# Patient Record
Sex: Female | Born: 1985 | Race: Black or African American | Hispanic: No | Marital: Single | Smoking: Former smoker
Health system: Southern US, Community
[De-identification: ages and names within clinical notes are randomized; demographics above are authoritative.]

## PROBLEM LIST (undated history)

## (undated) DIAGNOSIS — E785 Hyperlipidemia, unspecified: Secondary | ICD-10-CM

## (undated) DIAGNOSIS — R0602 Shortness of breath: Secondary | ICD-10-CM

## (undated) DIAGNOSIS — I209 Angina pectoris, unspecified: Secondary | ICD-10-CM

## (undated) DIAGNOSIS — I509 Heart failure, unspecified: Secondary | ICD-10-CM

## (undated) DIAGNOSIS — T7840XA Allergy, unspecified, initial encounter: Secondary | ICD-10-CM

## (undated) DIAGNOSIS — I10 Essential (primary) hypertension: Secondary | ICD-10-CM

## (undated) HISTORY — PX: NO PAST SURGERIES: SHX2092

## (undated) HISTORY — DX: Essential (primary) hypertension: I10

## (undated) HISTORY — DX: Hyperlipidemia, unspecified: E78.5

## (undated) HISTORY — DX: Heart failure, unspecified: I50.9

## (undated) HISTORY — DX: Allergy, unspecified, initial encounter: T78.40XA

---
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
## 2007-03-30 ENCOUNTER — Emergency Department (HOSPITAL_COMMUNITY): Admission: EM | Admit: 2007-03-30 | Discharge: 2007-03-31 | Payer: Self-pay | Admitting: *Deleted

## 2007-06-08 ENCOUNTER — Inpatient Hospital Stay (HOSPITAL_COMMUNITY): Admission: EM | Admit: 2007-06-08 | Discharge: 2007-06-11 | Payer: Self-pay | Admitting: Emergency Medicine

## 2007-06-11 ENCOUNTER — Ambulatory Visit: Payer: Self-pay | Admitting: *Deleted

## 2007-06-11 ENCOUNTER — Inpatient Hospital Stay (HOSPITAL_COMMUNITY): Admission: RE | Admit: 2007-06-11 | Discharge: 2007-06-16 | Payer: Self-pay | Admitting: *Deleted

## 2008-08-05 IMAGING — CR DG CHEST 2V
2 series · 2 of 2 positions shown · non-contrast
Comparison: none

CLINICAL DATA: Short of breath. 
 CHEST ? 2 VIEW:

[w chest lat *]
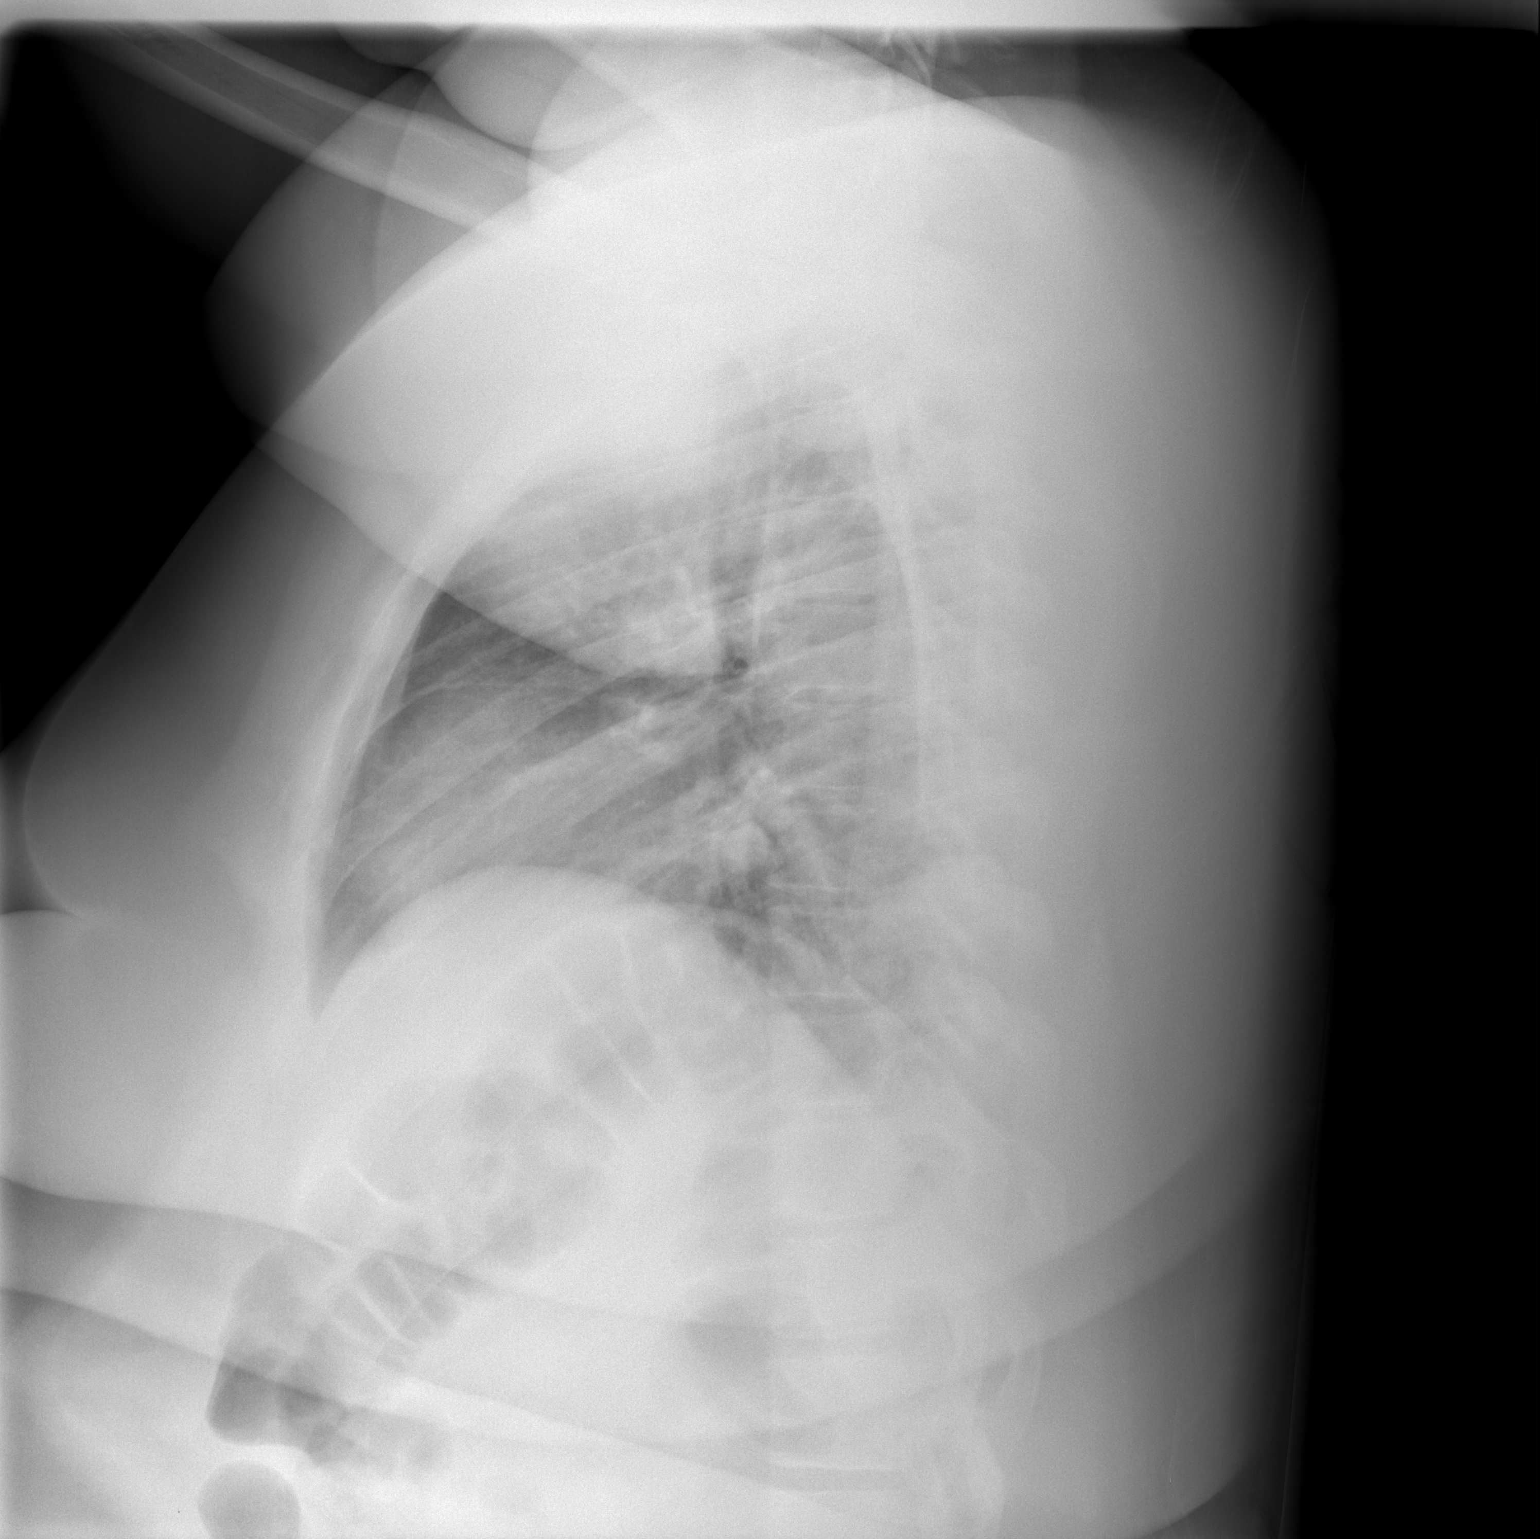

[view not recorded]
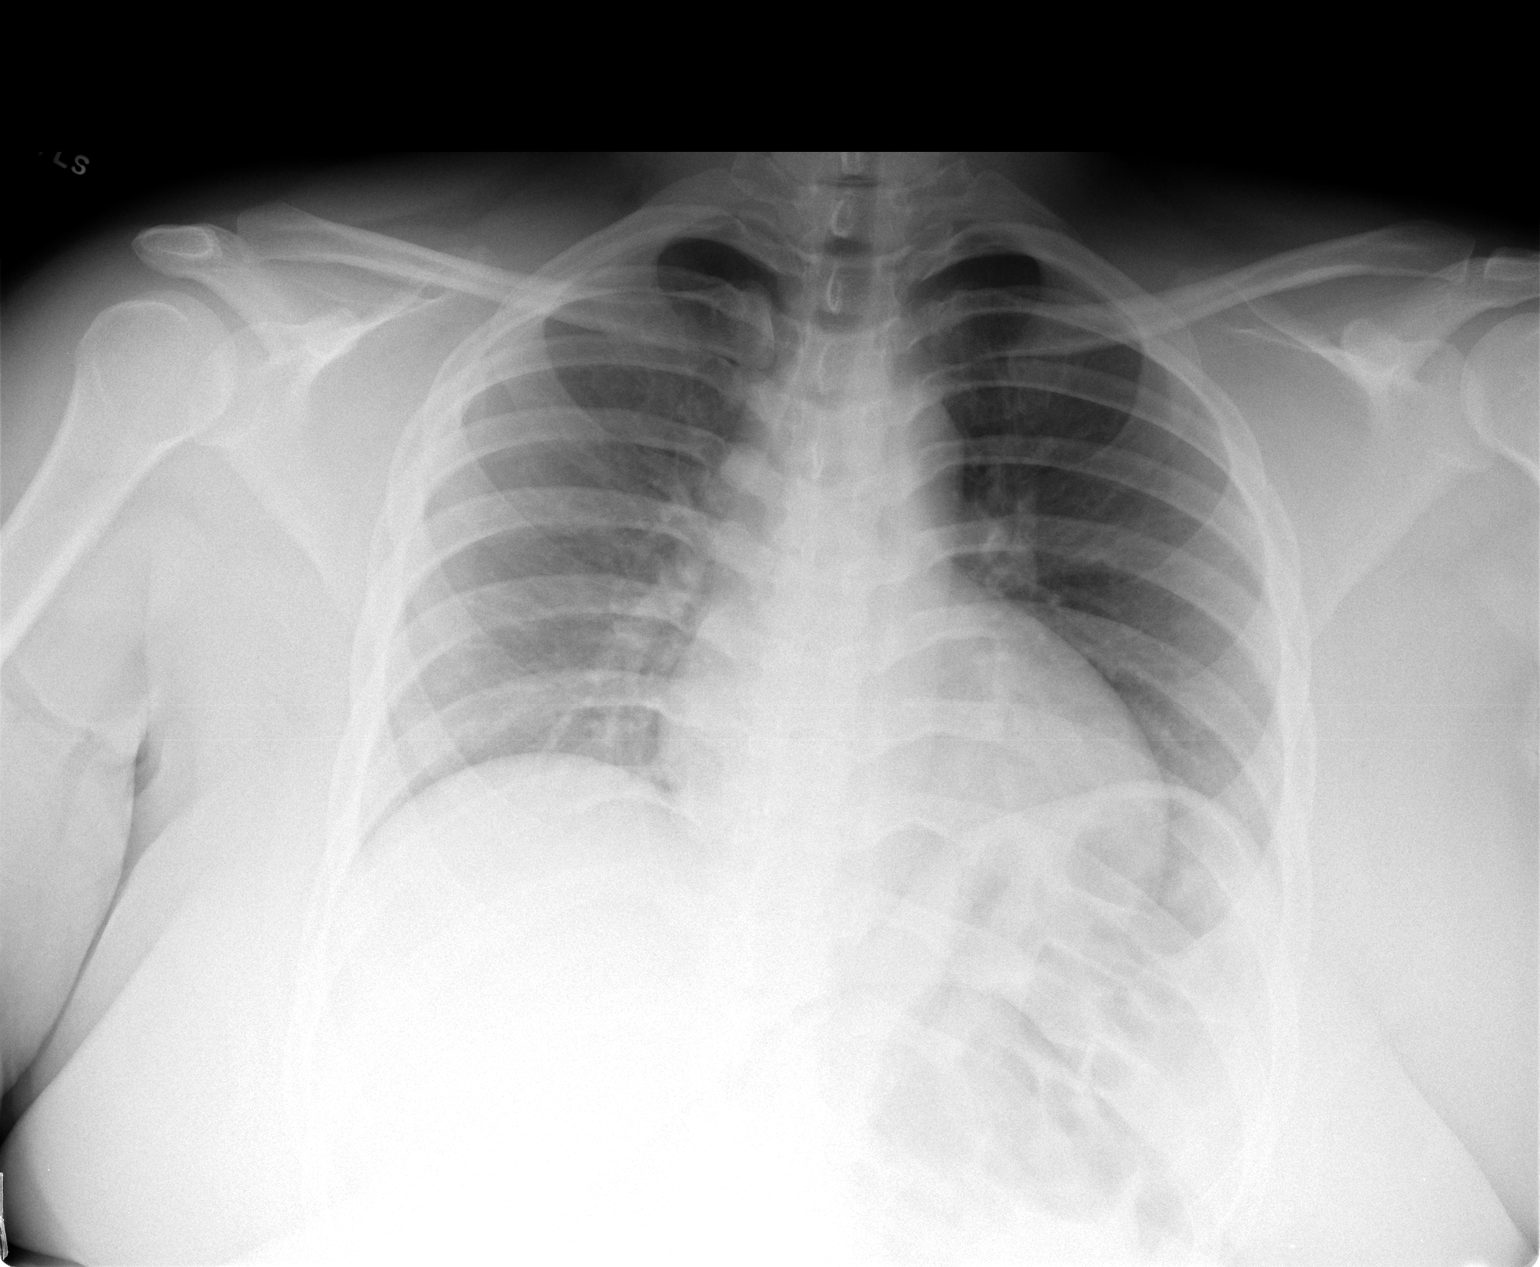

[2 of 2 positions shown; findings below may reference images not displayed]

FINDINGS: The heart size and mediastinal contours are within normal limits.  Both lungs are clear.  The visualized skeletal structures are unremarkable.
IMPRESSION: No active cardiopulmonary disease.

## 2008-08-06 IMAGING — RF DG FLUORO RM 1-60 MIN
1 series · 1 of 1 positions shown · non-contrast
Comparison: none

CLINICAL DATA: Fever

LUMBAR PUNCTURE WITH FLUOROSCOPIC GUIDANCE:

[Series 1: run · 1 of 1 slices shown]
[im 1/1]
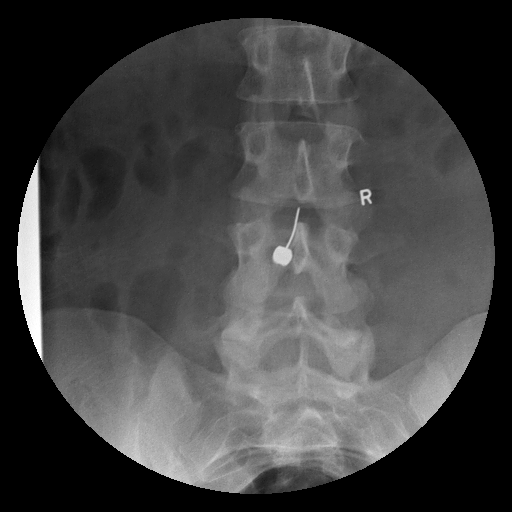

[1 of 1 positions shown; findings below may reference images not displayed]

FINDINGS: Written informed consent was obtained for the procedure. The patient
was placed prone on the Fluoro table and the back prepped and draped in sterile
fashion. The skin was anesthetized with 1% lidocaine. A 20  gauge spinal needle
was advanced into the thecal sac at the L3-L4 level. Opening pressure was 17 cm
of water. Approximately 4-6 cc of clear  CSF was collected. The patient
tolerated the procedure well.
IMPRESSION: Successful lumbar puncture under fluoroscopic guidance as described above.

## 2009-08-03 ENCOUNTER — Emergency Department (HOSPITAL_COMMUNITY): Admission: EM | Admit: 2009-08-03 | Discharge: 2009-08-03 | Payer: Self-pay | Admitting: Emergency Medicine

## 2011-04-07 LAB — RAPID STREP SCREEN (MED CTR MEBANE ONLY): Streptococcus, Group A Screen (Direct): POSITIVE — AB
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
## 2011-10-18 LAB — RAPID URINE DRUG SCREEN, HOSP PERFORMED
Amphetamines: NOT DETECTED
Barbiturates: NOT DETECTED
Benzodiazepines: POSITIVE — AB
Cocaine: NOT DETECTED
Opiates: POSITIVE — AB
Tetrahydrocannabinol: NOT DETECTED

## 2011-10-18 LAB — COMPREHENSIVE METABOLIC PANEL
ALT: 44 — ABNORMAL HIGH
ALT: 70 — ABNORMAL HIGH
AST: 14
AST: 42 — ABNORMAL HIGH
Albumin: 3.9
CO2: 26
CO2: 27
Calcium: 10
Chloride: 99
Creatinine, Ser: 0.89
Creatinine, Ser: 1.17
GFR calc Af Amer: >60
GFR calc Af Amer: >60
GFR calc non Af Amer: 60 — ABNORMAL LOW
GFR calc non Af Amer: >60
Potassium: 4
Sodium: 136
Sodium: 144
Total Bilirubin: 0.6
Total Protein: 8

## 2011-10-18 LAB — CBC
HCT: 39.5
Hemoglobin: 13.3
MCHC: 33
MCHC: 33.6
MCHC: 34
MCV: 80.2
MCV: 81
MCV: 81.8
Platelets: 336
Platelets: 432 — ABNORMAL HIGH
RBC: 4.88
RBC: 4.88
RBC: 5
RBC: 5.2 — ABNORMAL HIGH
RDW: 12.2
RDW: 13.1
WBC: 11.1 — ABNORMAL HIGH
WBC: 11.7 — ABNORMAL HIGH

## 2011-10-18 LAB — PREGNANCY, URINE: Preg Test, Ur: NEGATIVE

## 2011-10-18 LAB — BASIC METABOLIC PANEL
BUN: 6
CO2: 24
CO2: 28
Calcium: 9.2
Chloride: 101
Chloride: 104
Creatinine, Ser: 0.86
GFR calc Af Amer: >60
GFR calc Af Amer: >60
Glucose, Bld: 100 — ABNORMAL HIGH
Potassium: 3.1 — ABNORMAL LOW

## 2011-10-18 LAB — CSF CULTURE W GRAM STAIN: Gram Stain: NONE SEEN

## 2011-10-18 LAB — PROTEIN, TOTAL: Total Protein: 7.4

## 2011-10-18 LAB — URINALYSIS, ROUTINE W REFLEX MICROSCOPIC
Bilirubin Urine: NEGATIVE
Hgb urine dipstick: NEGATIVE
Hgb urine dipstick: NEGATIVE
Ketones, ur: 40 — AB
Nitrite: NEGATIVE
Nitrite: NEGATIVE
Protein, ur: 30 — AB
Protein, ur: NEGATIVE
Specific Gravity, Urine: 1.026
Urobilinogen, UA: 1
Urobilinogen, UA: 1

## 2011-10-18 LAB — URINE CULTURE: Colony Count: 75000

## 2011-10-18 LAB — URINE MICROSCOPIC-ADD ON

## 2011-10-18 LAB — DIFFERENTIAL
Eosinophils Absolute: 0.2
Eosinophils Relative: 1
Lymphocytes Relative: 28
Lymphs Abs: 3.8 — ABNORMAL HIGH
Lymphs Abs: 3.8 — ABNORMAL HIGH
Monocytes Absolute: 1 — ABNORMAL HIGH
Monocytes Relative: 8
Monocytes Relative: 9
Neutrophils Relative %: 63

## 2011-10-18 LAB — CSF CELL COUNT WITH DIFFERENTIAL: Tube #: 2

## 2011-10-18 LAB — PROTEIN AND GLUCOSE, CSF: Glucose, CSF: 67

## 2012-08-18 DIAGNOSIS — I209 Angina pectoris, unspecified: Secondary | ICD-10-CM

## 2012-08-18 HISTORY — DX: Angina pectoris, unspecified: I20.9

## 2012-08-19 ENCOUNTER — Observation Stay (HOSPITAL_COMMUNITY)
Admission: AD | Admit: 2012-08-19 | Discharge: 2012-08-20 | Disposition: A | Discharge status: Home / Self Care | Payer: 59 | Source: Ambulatory Visit | Attending: Cardiology | Admitting: Cardiology

## 2012-08-19 ENCOUNTER — Ambulatory Visit: Payer: 59

## 2012-08-19 ENCOUNTER — Ambulatory Visit (INDEPENDENT_AMBULATORY_CARE_PROVIDER_SITE_OTHER): Payer: 59 | Admitting: Family Medicine

## 2012-08-19 ENCOUNTER — Encounter (HOSPITAL_COMMUNITY): Payer: Self-pay | Admitting: General Practice

## 2012-08-19 VITALS — BP 198/150 | HR 91 | Temp 98.4°F | Resp 16 | Ht 61.75 in | Wt 236.2 lb

## 2012-08-19 DIAGNOSIS — I5031 Acute diastolic (congestive) heart failure: Secondary | ICD-10-CM | POA: Diagnosis present

## 2012-08-19 DIAGNOSIS — R079 Chest pain, unspecified: Secondary | ICD-10-CM

## 2012-08-19 DIAGNOSIS — R0602 Shortness of breath: Secondary | ICD-10-CM

## 2012-08-19 DIAGNOSIS — I110 Hypertensive heart disease with heart failure: Principal | ICD-10-CM | POA: Diagnosis present

## 2012-08-19 DIAGNOSIS — I10 Essential (primary) hypertension: Secondary | ICD-10-CM

## 2012-08-19 DIAGNOSIS — R0609 Other forms of dyspnea: Secondary | ICD-10-CM | POA: Insufficient documentation

## 2012-08-19 DIAGNOSIS — R0989 Other specified symptoms and signs involving the circulatory and respiratory systems: Secondary | ICD-10-CM | POA: Insufficient documentation

## 2012-08-19 DIAGNOSIS — I5033 Acute on chronic diastolic (congestive) heart failure: Secondary | ICD-10-CM | POA: Insufficient documentation

## 2012-08-19 HISTORY — DX: Shortness of breath: R06.02

## 2012-08-19 HISTORY — DX: Angina pectoris, unspecified: I20.9

## 2012-08-19 LAB — POCT URINALYSIS DIPSTICK
Glucose, UA: NEGATIVE
Leukocytes, UA: NEGATIVE
Nitrite, UA: NEGATIVE

## 2012-08-19 LAB — COMPREHENSIVE METABOLIC PANEL
Alkaline Phosphatase: 42 U/L (ref 39–117)
BUN: 15 mg/dL (ref 6–23)
Calcium: 9.5 mg/dL (ref 8.4–10.5)
GFR calc Af Amer: 86 mL/min — ABNORMAL LOW (ref >90)
Glucose, Bld: 84 mg/dL (ref 70–99)
Total Protein: 7.5 g/dL (ref 6.0–8.3)

## 2012-08-19 LAB — POCT CBC
Granulocyte percent: 65 %G (ref 37–80)
HCT, POC: 46.6 % (ref 37.7–47.9)
MCV: 83.4 fL (ref 80–97)
POC Granulocyte: 5.4 (ref 2–6.9)
POC LYMPH PERCENT: 29.9 %L (ref 10–50)
Platelet Count, POC: 411 10*3/uL (ref 142–424)
RBC: 5.59 M/uL — AB (ref 4.04–5.48)
RDW, POC: 15 %

## 2012-08-19 LAB — POCT UA - MICROSCOPIC ONLY
Mucus, UA: POSITIVE
Yeast, UA: NEGATIVE

## 2012-08-19 LAB — POCT URINE PREGNANCY: Preg Test, Ur: NEGATIVE

## 2012-08-19 LAB — CBC
Hemoglobin: 14.1 g/dL (ref 12.0–15.0)
MCH: 27.1 pg (ref 26.0–34.0)
MCHC: 34.2 g/dL (ref 30.0–36.0)
Platelets: 335 10*3/uL (ref 150–400)
RDW: 14.1 % (ref 11.5–15.5)

## 2012-08-19 LAB — CARDIAC PANEL(CRET KIN+CKTOT+MB+TROPI): Total CK: 136 U/L (ref 7–177)

## 2012-08-19 MED ORDER — SODIUM CHLORIDE 0.9 % IJ SOLN
3.0000 mL | Freq: Two times a day (BID) | INTRAMUSCULAR | Status: DC
  Administered 2012-08-19 – 2012-08-20 (×2): 3 mL via INTRAVENOUS

## 2012-08-19 MED ORDER — LISINOPRIL 10 MG PO TABS
10.0000 mg | ORAL_TABLET | Freq: Every day | ORAL | Status: DC
  Administered 2012-08-19: 10 mg via ORAL
  Filled 2012-08-19 (×2): qty 1

## 2012-08-19 MED ORDER — HEPARIN SODIUM (PORCINE) 5000 UNIT/ML IJ SOLN
5000.0000 [IU] | Freq: Three times a day (TID) | INTRAMUSCULAR | Status: DC
  Administered 2012-08-19 – 2012-08-20 (×3): 5000 [IU] via SUBCUTANEOUS
  Filled 2012-08-19 (×7): qty 1

## 2012-08-19 MED ORDER — FENTANYL CITRATE 0.05 MG/ML IJ SOLN
25.0000 ug | INTRAMUSCULAR | Status: DC | PRN
  Administered 2012-08-19 – 2012-08-20 (×2): 50 ug via INTRAVENOUS
  Filled 2012-08-19 (×3): qty 2

## 2012-08-19 MED ORDER — AMLODIPINE BESYLATE 5 MG PO TABS
5.0000 mg | ORAL_TABLET | Freq: Every day | ORAL | Status: DC
  Administered 2012-08-19: 5 mg via ORAL
  Filled 2012-08-19 (×2): qty 1

## 2012-08-19 MED ORDER — SODIUM CHLORIDE 0.9 % IJ SOLN: 3.0000 mL | Freq: Two times a day (BID) | INTRAMUSCULAR | Status: DC

## 2012-08-19 MED ORDER — LABETALOL HCL 100 MG PO TABS
100.0000 mg | ORAL_TABLET | Freq: Once | ORAL | Status: AC
  Administered 2012-08-19: 100 mg via ORAL

## 2012-08-19 MED ORDER — CARVEDILOL 12.5 MG PO TABS
12.5000 mg | ORAL_TABLET | Freq: Two times a day (BID) | ORAL | Status: DC
  Administered 2012-08-19: 12.5 mg via ORAL
  Filled 2012-08-19 (×4): qty 1

## 2012-08-19 MED ORDER — ASPIRIN EC 81 MG PO TBEC
81.0000 mg | DELAYED_RELEASE_TABLET | Freq: Every day | ORAL | Status: DC
  Administered 2012-08-19 – 2012-08-20 (×2): 81 mg via ORAL
  Filled 2012-08-19 (×2): qty 1

## 2012-08-19 MED ORDER — SODIUM CHLORIDE 0.9 % IV SOLN: 250.0000 mL | INTRAVENOUS | Status: DC | PRN

## 2012-08-19 MED ORDER — HYDROCHLOROTHIAZIDE 25 MG PO TABS
25.0000 mg | ORAL_TABLET | Freq: Every morning | ORAL | Status: DC
  Administered 2012-08-20: 25 mg via ORAL
  Filled 2012-08-19: qty 1

## 2012-08-19 MED ORDER — HYDRALAZINE HCL 20 MG/ML IJ SOLN
10.0000 mg | INTRAMUSCULAR | Status: DC | PRN
  Administered 2012-08-19: 10 mg via INTRAVENOUS
  Filled 2012-08-19: qty 1

## 2012-08-19 MED ORDER — SODIUM CHLORIDE 0.9 % IJ SOLN: 3.0000 mL | INTRAMUSCULAR | Status: DC | PRN

## 2012-08-19 MED ORDER — HYDRALAZINE HCL 50 MG PO TABS
50.0000 mg | ORAL_TABLET | Freq: Four times a day (QID) | ORAL | Status: DC
  Administered 2012-08-19 – 2012-08-20 (×2): 50 mg via ORAL
  Filled 2012-08-19 (×5): qty 1

## 2012-08-19 NOTE — Patient Instructions (Addendum)
1. Chest pain  DG Chest 2 View, POCT CBC, POCT urine pregnancy, POCT UA - Microscopic Only, POCT urinalysis dipstick, Comprehensive metabolic panel, TSH, Electrocardiogram report, Ambulatory referral to Cardiology  2. Shortness of breath  POCT CBC, POCT urine pregnancy, POCT UA - Microscopic Only, POCT urinalysis dipstick, Comprehensive metabolic panel, TSH, Electrocardiogram report, Ambulatory referral to Cardiology  3. Hypertension  labetalol (NORMODYNE) tablet 100 mg, Ambulatory referral to Cardiology

## 2012-08-19 NOTE — H&P (Signed)
Robin May is an 26 y.o. female.   Chief Complaint: Shortness of breath and dyspnea on exertion.  Uncontrolled hypertension. HPI: Robin May is a 18 African American female with long-standing history of hypertension who had last seen her physician a year ago and had stopped taking medications due to insurance reasons and did not follow up with anybody was seen today by Dr. Nilda Simmer at [facility] for  acute onset of shortness of breath and dyspnea on exertion. Patient states that last weekend she gone with her friends to the beach and with exertional activity she would notice shortness of breath but she never does.  Since then she has noticed worsening shortness of breath.  Yesterday she had a hard time going to work but still pushed herself to do this and last evening she could not lay down in bed as she would get extremely short of breath and had to sit up pretty much all night and felt very ill.  She thought she needed to be seen last night by somebody but that she will feel better in the morning.  She decided to go back to work this morning, however due to worsening symptoms could not do this. Patient also complained of chest heaviness that started yesterday with exertional activities in the middle of her chest.  It was easily relieved with rest.  No recurrence of chest pain this morning.  Presently she states that she is doing fine at rest.  She has no hemoptysis, syncope, dizziness, visual disturbances, TIA, chest pain, palpitations.  Otherwise she states that she is doing well.  Past Medical History  Diagnosis Date  . Hypertension     No past surgical history on file.  Family History  Problem Relation Age of Onset  . Hypertension Mother    Social History:  reports that she has quit smoking. She does not have any smokeless tobacco history on file. She reports that she drinks about .6 ounces of alcohol per week. She reports that she does not use illicit  drugs.  Allergies: No Known Allergies  Medications Prior to Admission  Medication Sig Dispense Refill  . Cyanocobalamin (VITAMIN B 12 PO) Take 1 tablet by mouth daily.      Marland Kitchen ibuprofen (ADVIL,MOTRIN) 200 MG tablet Take 600 mg by mouth every 6 (six) hours as needed. For pain      . naproxen sodium (ANAPROX) 220 MG tablet Take 220 mg by mouth 2 (two) times daily as needed. For pain        Results for orders placed in visit on 08/19/12 (from the past 48 hour(s))  POCT CBC     Status: Abnormal   Collection Time   08/19/12 11:53 AM      Component Value Range Comment   WBC 8.3  4.6 - 10.2 K/uL    Lymph, poc 2.5  0.6 - 3.4    POC LYMPH PERCENT 29.9  10 - 50 %L    MID (cbc) 0.4  0 - 0.9    POC MID % 5.1  0 - 12 %M    POC Granulocyte 5.4  2 - 6.9    Granulocyte percent 65.0  37 - 80 %G    RBC 5.59 (*) 4.04 - 5.48 M/uL    Hemoglobin 14.2  12.2 - 16.2 g/dL    HCT, POC 11.9  14.7 - 47.9 %    MCV 83.4  80 - 97 fL    MCH, POC 25.4 (*)  27 - 31.2 pg    MCHC 30.5 (*) 31.8 - 35.4 g/dL    RDW, POC 16.1      Platelet Count, POC 411  142 - 424 K/uL    MPV 9.8  0 - 99.8 fL   POCT URINE PREGNANCY     Status: Normal   Collection Time   08/19/12 11:53 AM      Component Value Range Comment   Preg Test, Ur Negative     POCT UA - MICROSCOPIC ONLY     Status: Abnormal   Collection Time   08/19/12 11:53 AM      Component Value Range Comment   WBC, Ur, HPF, POC 0-1      RBC, urine, microscopic 1-2      Bacteria, U Microscopic negative      Mucus, UA positive      Epithelial cells, urine per micros 0-1      Crystals, Ur, HPF, POC negative      Casts, Ur, LPF, POC negative      Yeast, UA negative     POCT URINALYSIS DIPSTICK     Status: Normal   Collection Time   08/19/12 11:53 AM      Component Value Range Comment   Color, UA dark yellow      Clarity, UA clear      Glucose, UA negative      Bilirubin, UA small      Ketones, UA trace      Spec Grav, UA >=1.030      Blood, UA trace-intact       pH, UA 6.5      Protein, UA >=300      Urobilinogen, UA 2.0      Nitrite, UA negative      Leukocytes, UA Negative      Dg Chest 2 View  08/19/2012  *RADIOLOGY REPORT*  Clinical Data: Wheezing, cough  CHEST - 2 VIEW  Comparison: None.  Findings: Suspected retrocardiac opacity, suspicious for pneumonia. No pleural effusion or pneumothorax.  Mild cardiomegaly.  Visualized osseous structures are within normal limits.  IMPRESSION: Suspected retrocardiac opacity, suspicious for left lower lobe pneumonia.  Follow-up chest radiographs are suggested to document resolution.  Mild cardiomegaly.  These results will be called to the ordering clinician or representative by the Radiologist Assistant, and communication documented in the PACS Dashboard.  Clinically significant discrepancy from primary report, if provided: Suspected left lower lobe pneumonia   Original Report Authenticated By: Charline Bills, M.D.     ROS: Arthritis-no;  Cramping of the legs and feet at night or with activity-not often; about twice a month; Diabetes-no;  Hypothyroidism-no;  Previous Stroke-no, Previous GI Bleed-no ,  Recent weight change-no, Symptoms to suggest sleep apnea like loud snoring, daytime sleepiness-snores occasionally, Other systems negative.  Blood pressure 203/150, pulse 96, temperature 98.8 F (37.1 C), temperature source Oral, resp. rate 16, last menstrual period 08/02/2012, SpO2 96.00%. BMI 40.51. BP equal in both arms.  General appearance: alert, cooperative, appears stated age and no distress Morbidly obese.  Eyes: negative findings: lids and lashes normal, conjunctivae and sclerae normal and corneas clear Neck: no adenopathy, no carotid bruit, no JVD, supple, symmetrical, trachea midline and thyroid not enlarged, symmetric, no tenderness/mass/nodules Neck: JVP - normal, carotids 2+= without bruits Resp: clear to auscultation bilaterally Chest wall: no tenderness Cardio: S1, S2 normal, S4 present and no  rub GI: soft, non-tender; bowel sounds normal; no masses,  no organomegaly Extremities: extremities normal,  atraumatic, no cyanosis or edema Pulses: 2+ and symmetric Skin: Skin color, texture, turgor normal. No rashes or lesions Neurologic: Alert and oriented X 3, normal strength and tone. Normal symmetric reflexes. Normal coordination and gait   Assessment/Plan  1.  Hypertensive urgency with patient presenting with acute on chronic diastolic heart failure. 2.  Hypertension with hypertensive heart disease. 3.  Morbid obesity with a BMI of 40.10. 4.  Shortness of breath and dyspnea on exertion. 5. Chest pain probably due to # 1.  Recommendation: Patient will be admitted to the hospital for overnight observation and may need to be extended for 48 hours until blood pressure is controlled.  Although the chest x-ray looks abnormal, I do not suspect pneumonia.  There is no mediastinal enlargement.  EKG performed on outpatient basis reveals normal sinus rhythm, normal axis without any evidence of ischemia. There was non specific T inversion in I and aVL. I will obtain an echocardiogram to evaluate her LV systolic function.  Further recommendations will follow.  I have discussed with the patient and her mother at the bedside regarding high risk nature of patient's underlying medical issues.  I will continue to reinforce lifestyle, cardiovascular risk modification.  Pamella Pert, MD 08/19/2012, 5:01 PM

## 2012-08-19 NOTE — Progress Notes (Signed)
Subjective:    Patient ID: Robin May, female    DOB: Jul 11, 1986, 26 y.o.   MRN: [id number]  HPIThis 26 y.o. female presents for evaluation of chest pain, shortness of breath.  Went to sleep last night around 1:00am; +SOB upon laying supine.  Unable to sleep due to orthopnea.  If sits up, feels fine.  +mild cough onset last night and today.  No sputum.  No malaise, fatigue.  When gets up to walk around, DOE and chest tightness.  +chest tightness substernal region with exertion; severity 3/10.  SOB is major symptom.  No recent swelling in legs or abdomen.  Just returned from beach; trip in car for 3.5 hours; stopped 1-2 times on trip.  No pain in calves.  No fever/chills/sweats.  No rhinorrhea or nasal congestion.  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  Had to sit down due to SOB.  No similar symptoms.  No chest pain currently.  No radiation into shoulder; +diaphoresis; no nausea or vomiting.  +radiation into back; no indigestion or heartburn; ate last night 6:00pm; grilled chicken nuggets, lemonade.    2.  HTN: no medications; runs 170 systolic.  Checks at home every other week; BP  160-180/unknown.  No headache; no vision changes; no dizziness; no focal tingling except in L foot (new).  No focal weakness.  Previously on Metoprolol a while back; prescribed by [facility] physician; no medication in 2-3 years.  Stopped medication secondary to financial reasons and no insurance.    PMH:  HTN since age 54.  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 Lives with mom. Family: M- 56; HTN, OA              F-63; healthy              Siblings: unknown; in [place].     Review of Systems  Constitutional: Positive for diaphoresis, activity change and fatigue.  Negative for fever, chills, appetite change and unexpected weight change.  HENT: Negative for congestion, facial swelling, rhinorrhea, neck pain and neck stiffness.   Eyes: Negative for photophobia and visual disturbance.  Respiratory: Positive for cough, chest tightness and shortness of breath. Negative for wheezing.   Cardiovascular: Positive for chest pain. Negative for palpitations and leg swelling.  Gastrointestinal: Negative for nausea, vomiting, abdominal pain, diarrhea, constipation and abdominal distention.  Skin: Negative for pallor and rash.  Neurological: Negative for dizziness, tremors, syncope, facial asymmetry, speech difficulty, weakness, light-headedness, numbness and headaches.  Psychiatric/Behavioral: Negative for disturbed wake/sleep cycle and dysphoric mood. The patient is not nervous/anxious.     Past Medical History  Diagnosis Date  . Hypertension     No past surgical history on file.  Prior to Admission medications   Not on File    No Known Allergies  History   Social History  . Marital Status: Single    Spouse Name: N/A    Number of Children: N/A  . Years of Education: N/A   Occupational History  . Not on file.   Social History Main Topics  . Smoking status: Former Games developer  . Smokeless tobacco: Not on file  . Alcohol Use: 0.6 oz/week    1 Glasses of wine per week  . Drug Use:  No  . Sexually Active: Not Currently     Last sexual activity 02/2012.   Other Topics Concern  . Not on file   Social History Narrative   Marital status: single.Lives with: motherChildren: noneEmployment: works at [employer] in 25year old class since 2008.Tobacco abuse: noneAlcohol: 1 glass of wine weeklyDrugs: noneExercise: gym three days per week (elliptical, treadmill, weights).    Family History  Problem Relation Age of Onset  . Hypertension Mother       Objective:   Physical Exam  Constitutional: She is oriented to person, place, and time. She appears  well-developed and well-nourished. No distress.  HENT:  Head: Normocephalic and atraumatic.  Right Ear: External ear normal.  Left Ear: External ear normal.  Nose: Nose normal.  Mouth/Throat: Oropharynx is clear and moist.  Eyes: Conjunctivae and EOM are normal. Pupils are equal, round, and reactive to light.  Neck: Normal range of motion. Neck supple. No JVD present. No thyromegaly present.  Cardiovascular: Normal rate, regular rhythm and intact distal pulses.  Exam reveals no gallop and no friction rub.   No murmur heard. Pulmonary/Chest: Effort normal and breath sounds normal. No respiratory distress. She has no wheezes. She has no rales. She exhibits no tenderness.  Abdominal: Soft. Bowel sounds are normal. She exhibits no distension and no mass. There is tenderness. There is no rebound and no guarding.       +TTP EPIGASTRIC>RUQ; NO G/R.    Lymphadenopathy:    She has no cervical adenopathy.  Neurological: She is alert and oriented to person, place, and time. No cranial nerve deficit. She exhibits normal muscle tone.  Skin: Skin is warm and dry. She is not diaphoretic.  Psychiatric: She has a normal mood and affect. Her behavior is normal. Judgment and thought content normal.    Results for orders placed in visit on 08/19/12  POCT CBC      Component Value Range   WBC 8.3  4.6 - 10.2 K/uL   Lymph, poc 2.5  0.6 - 3.4   POC LYMPH PERCENT 29.9  10 - 50 %L   MID (cbc) 0.4  0 - 0.9   POC MID % 5.1  0 - 12 %M   POC Granulocyte 5.4  2 - 6.9   Granulocyte percent 65.0  37 - 80 %G   RBC 5.59 (*) 4.04 - 5.48 M/uL   Hemoglobin 14.2  12.2 - 16.2 g/dL   HCT, POC 16.1  09.6 - 47.9 %   MCV 83.4  80 - 97 fL   MCH, POC 25.4 (*) 27 - 31.2 pg   MCHC 30.5 (*) 31.8 - 35.4 g/dL   RDW, POC 04.5     Platelet Count, POC 411  142 - 424 K/uL   MPV 9.8  0 - 99.8 fL  POCT URINE PREGNANCY      Component Value Range   Preg Test, Ur Negative    POCT UA - MICROSCOPIC ONLY      Component Value Range    WBC, Ur, HPF, POC 0-1     RBC, urine, microscopic 1-2     Bacteria, U Microscopic negative     Mucus, UA positive     Epithelial cells, urine per micros 0-1     Crystals, Ur, HPF, POC negative     Casts, Ur, LPF, POC negative     Yeast, UA negative    POCT URINALYSIS DIPSTICK      Component Value Range   Color, UA dark  yellow     Clarity, UA clear     Glucose, UA negative     Bilirubin, UA small     Ketones, UA trace     Spec Grav, UA >=1.030     Blood, UA trace-intact     pH, UA 6.5     Protein, UA >=300     Urobilinogen, UA 2.0     Nitrite, UA negative     Leukocytes, UA Negative      [facility] reading (PRIMARY) by  Dr. Katrinka Blazing.  CXR: cardiomegaly, NAD.      Assessment & Plan:   1. Chest pain  DG Chest 2 View, POCT CBC, POCT urine pregnancy, POCT UA - Microscopic Only, POCT urinalysis dipstick, Comprehensive metabolic panel, TSH, Electrocardiogram report, Ambulatory referral to Cardiology  2. Shortness of breath  POCT CBC, POCT urine pregnancy, POCT UA - Microscopic Only, POCT urinalysis dipstick, Comprehensive metabolic panel, TSH, Electrocardiogram report, Ambulatory referral to Cardiology  3. Hypertension  labetalol (NORMODYNE) tablet 100 mg, Ambulatory referral to Cardiology  4.  Proteinuria  1.  Chest pain: New.  Exertional and associated with DOE and orthopnea, diaphoresis.  Mild 3/10; resolves with rest.  Likely secondary to HTN uncontrolled.  EKG with mild T wave changes I, AvL; CXR with cardiomegaly.  Refer to cardiology for 2D-echo +/- Cardiolite.  To ED for acute worsening; no work until evaluated by cardiology.  Dr. Nadara Eaton to see today at 1:30pm.   2.  SOB/DOE/Orthopnea: New.  Primarily orthopnea, DOE.  Normal pulse oximetry; CXR with cardiomegaly.  Refer to cardiology today/[facility]. 3.  HTN Urgency:  New.  S/p Labetalol 100mg  po in office.  Neurologically intact; asymptomatic at rest.    Obtain labs.  No rx provided due to cardiology appointment today.  Emphasized importance  of compliance with medications to prevent end organ damage. 4.  Proteinuria:  New.  Likely secondary to HTN.  Obtain labs.

## 2012-08-20 DIAGNOSIS — I5031 Acute diastolic (congestive) heart failure: Secondary | ICD-10-CM | POA: Diagnosis present

## 2012-08-20 LAB — BASIC METABOLIC PANEL
Chloride: 105 mEq/L (ref 96–112)
Creatinine, Ser: 0.94 mg/dL (ref 0.50–1.10)
GFR calc Af Amer: >90 mL/min (ref >90)
GFR calc non Af Amer: 84 mL/min — ABNORMAL LOW (ref >90)

## 2012-08-20 LAB — CARDIAC PANEL(CRET KIN+CKTOT+MB+TROPI)
Relative Index: 1.4 (ref 0.0–2.5)
Total CK: 118 U/L (ref 7–177)
Troponin I: <0.3 ng/mL (ref <0.30)

## 2012-08-20 LAB — COMPREHENSIVE METABOLIC PANEL
ALT: 27 U/L (ref 0–35)
AST: 18 U/L (ref 0–37)
Albumin: 4.2 g/dL (ref 3.5–5.2)
CO2: 27 mEq/L (ref 19–32)
Calcium: 9.5 mg/dL (ref 8.4–10.5)
Chloride: 103 mEq/L (ref 96–112)
Potassium: 4.1 mEq/L (ref 3.5–5.3)
Total Protein: 6.8 g/dL (ref 6.0–8.3)

## 2012-08-20 LAB — HEMOGLOBIN A1C: Mean Plasma Glucose: 111 mg/dL (ref <117)

## 2012-08-20 LAB — PRO B NATRIURETIC PEPTIDE: Pro B Natriuretic peptide (BNP): 2143 pg/mL — ABNORMAL HIGH (ref 0–125)

## 2012-08-20 LAB — TSH: TSH: 2.722 u[IU]/mL (ref 0.350–4.500)

## 2012-08-20 MED ORDER — AMLODIPINE BESYLATE 10 MG PO TABS: 10.0000 mg | ORAL_TABLET | Freq: Every day | ORAL | Status: DC

## 2012-08-20 MED ORDER — HYDROCHLOROTHIAZIDE 25 MG PO TABS: 25.0000 mg | ORAL_TABLET | Freq: Every morning | ORAL | Status: DC

## 2012-08-20 MED ORDER — AMLODIPINE BESYLATE 10 MG PO TABS
10.0000 mg | ORAL_TABLET | Freq: Every day | ORAL | Status: DC
  Administered 2012-08-20: 10 mg via ORAL
  Filled 2012-08-20: qty 1

## 2012-08-20 MED ORDER — ACETAMINOPHEN 325 MG PO TABS
650.0000 mg | ORAL_TABLET | ORAL | Status: DC | PRN
  Administered 2012-08-20: 650 mg via ORAL

## 2012-08-20 MED ORDER — ACETAMINOPHEN 325 MG PO TABS: 650.0000 mg | ORAL_TABLET | ORAL | Status: AC | PRN

## 2012-08-20 MED ORDER — CARVEDILOL 25 MG PO TABS: 25.0000 mg | ORAL_TABLET | Freq: Two times a day (BID) | ORAL | Status: DC

## 2012-08-20 MED ORDER — LISINOPRIL 20 MG PO TABS: 20.0000 mg | ORAL_TABLET | Freq: Every day | ORAL | Status: DC

## 2012-08-20 MED ORDER — LISINOPRIL 20 MG PO TABS
20.0000 mg | ORAL_TABLET | Freq: Every day | ORAL | Status: DC
  Administered 2012-08-20: 20 mg via ORAL
  Filled 2012-08-20: qty 1

## 2012-08-20 MED ORDER — CARVEDILOL 25 MG PO TABS
25.0000 mg | ORAL_TABLET | Freq: Two times a day (BID) | ORAL | Status: DC
  Administered 2012-08-20: 25 mg via ORAL
  Filled 2012-08-20 (×3): qty 1

## 2012-08-20 NOTE — Discharge Summary (Signed)
Physician Discharge Summary  Patient ID: Robin May MRN: [id number] DOB/AGE: May 29, 1986 26 y.o.  Admit date: 08/19/2012 Discharge date: 08/20/2012  Primary Discharge Diagnosis:  Hypertension with hypertensive heart disease Hypertensive emergency with chest pain and shortness of breath Acute diastolic heart failure secondary to uncontrolled hypertension  Secondary Discharge Diagnosis Morbid obesity  Significant Diagnostic Studies:  Consults:   Hospital Course: Patient was admitted to the hospital after feeling ill patients having with uncontrolled blood pressure with a diastolic blood pressure of 160 mmHg.  She was felt to be unstable for patient management due to orthopnea, shortness of breath and chest pain. She was ruled out for myocardial infarction, BNP was elevated to greater than 2000, and lites BUN and creatinine along with fever within normal limits. On the day of discharge her blood pressure was reasonably well controlled and hence felt to be stable for discharge.  She did complain of mild headaches which I suspect is due to rapid decrease in blood pressure and vasodilatory effect of antidepressant medications.  She had no nausea, vomiting or any neurological deficits or tingling or numbness in the extremities.  She was able to ablate in the hallway without any problems.  Hence felt stable for discharge with outpatient followup and.   Discharge Exam: Blood pressure 151/99, pulse 83, temperature 98.1 F (36.7 C), temperature source Oral, resp. rate 20, height 5\' 2"  (1.575 m), weight 107.1 kg (236 lb 1.8 oz), last menstrual period 08/02/2012, SpO2 99.00%.   General appearance: alert, cooperative, appears stated age and no distress Morbidly obese.  Eyes: negative findings: lids and lashes normal, conjunctivae and sclerae normal and corneas clear  Neck: no adenopathy, no carotid bruit, no JVD, supple, symmetrical, trachea midline and thyroid not enlarged, symmetric, no  tenderness/mass/nodules  Neck: JVP - normal, carotids 2+= without bruits  Resp: clear to auscultation bilaterally  Chest wall: no tenderness  Cardio: S1, S2 normal, S4 present and no rub  GI: soft, non-tender; bowel sounds normal; no masses, no organomegaly  Extremities: extremities normal, atraumatic, no cyanosis or edema  Pulses: 2+ and symmetric  Skin: Skin color, texture, turgor normal. No rashes or lesions     Labs:   Lab Results  Component Value Date   WBC 8.7 08/19/2012   HGB 14.1 08/19/2012   HCT 41.2 08/19/2012   MCV 79.1 08/19/2012   PLT 335 08/19/2012    Lab 08/20/12 0900 08/19/12 1719  NA 140 --  K 3.7 --  CL 105 --  CO2 28 --  BUN 11 --  CREATININE 0.94 --  CALCIUM 9.5 --  PROT -- 7.5  BILITOT -- 1.1  ALKPHOS -- 42  ALT -- 28  AST -- 21  GLUCOSE 103* --   Lab Results  Component Value Date   CKTOTAL 118 08/20/2012   CKMB 1.7 08/20/2012   TROPONINI <0.30 08/20/2012    BNP    Component Value Date/Time   PROBNP 2143.0* 08/20/2012 0535    Radiology: Dg Chest 2 View  08/19/2012  *RADIOLOGY REPORT*  Clinical Data: Wheezing, cough  CHEST - 2 VIEW  Comparison: None.  Findings: Suspected retrocardiac opacity, suspicious for pneumonia. No pleural effusion or pneumothorax.  Mild cardiomegaly.  Visualized osseous structures are within normal limits.  IMPRESSION: Suspected retrocardiac opacity, suspicious for left lower lobe pneumonia.  Follow-up chest radiographs are suggested to document resolution.  Mild cardiomegaly.  These results will be called to the ordering clinician or representative by the Radiologist Assistant, and communication documented in the  PACS Dashboard.  Clinically significant discrepancy from primary report, if provided: Suspected left lower lobe pneumonia   Original Report Authenticated By: Charline Bills, M.D.     EKG:NSR @ 83/min. Normal intervals. Non specific T inversion in lateral leads (I, aVL). No ischemia.  FOLLOW UP PLANS AND  APPOINTMENTS  Medication List  As of 08/20/2012 12:52 PM   STOP taking these medications         ibuprofen 200 MG tablet      naproxen sodium 220 MG tablet         TAKE these medications         acetaminophen 325 MG tablet   Commonly known as: TYLENOL   Take 2 tablets (650 mg total) by mouth every 4 (four) hours as needed.      amLODipine 10 MG tablet   Commonly known as: NORVASC   Take 1 tablet (10 mg total) by mouth daily.      carvedilol 25 MG tablet   Commonly known as: COREG   Take 1 tablet (25 mg total) by mouth 2 (two) times daily with a meal.      hydrochlorothiazide 25 MG tablet   Commonly known as: HYDRODIURIL   Take 1 tablet (25 mg total) by mouth every morning.      lisinopril 20 MG tablet   Commonly known as: PRINIVIL,ZESTRIL   Take 1 tablet (20 mg total) by mouth daily.      VITAMIN B 12 PO   Take 1 tablet by mouth daily.           Follow-up Information    Follow up with Pamella Pert, MD on 08/28/2012. (11:30 AM)    Contact information:   [address]  [place] [address] [phone]       Call SMITH,KRISTI, MD. (To be seen in 2 weeks)    Contact information:   [address] [place] [address] [phone]           Pamella Pert, MD 08/20/2012, 12:52 PM

## 2012-08-20 NOTE — Progress Notes (Signed)
  Echocardiogram 2D Echocardiogram has been performed.  [place] 08/20/2012, 2:29 PM

## 2012-08-21 NOTE — Progress Notes (Signed)
Reviewed and agree.

## 2012-09-26 ENCOUNTER — Encounter: Payer: Self-pay | Admitting: Family Medicine

## 2012-09-26 DIAGNOSIS — I110 Hypertensive heart disease with heart failure: Secondary | ICD-10-CM

## 2012-11-02 ENCOUNTER — Ambulatory Visit (INDEPENDENT_AMBULATORY_CARE_PROVIDER_SITE_OTHER): Payer: 59 | Admitting: Family Medicine

## 2012-11-02 VITALS — BP 89/60 | HR 89 | Temp 99.9°F | Resp 16 | Ht 61.0 in | Wt 233.2 lb

## 2012-11-02 DIAGNOSIS — J40 Bronchitis, not specified as acute or chronic: Secondary | ICD-10-CM

## 2012-11-02 DIAGNOSIS — I10 Essential (primary) hypertension: Secondary | ICD-10-CM

## 2012-11-02 MED ORDER — HYDROCODONE-HOMATROPINE 5-1.5 MG/5ML PO SYRP: 5.0000 mL | ORAL_SOLUTION | Freq: Three times a day (TID) | ORAL | Status: DC | PRN

## 2012-11-02 MED ORDER — MOMETASONE FURO-FORMOTEROL FUM 200-5 MCG/ACT IN AERO: 2.0000 | INHALATION_SPRAY | Freq: Two times a day (BID) | RESPIRATORY_TRACT | Status: DC

## 2012-11-02 MED ORDER — AZITHROMYCIN 250 MG PO TABS: ORAL_TABLET | ORAL | Status: DC

## 2012-11-02 NOTE — Progress Notes (Signed)
@[email or web address] @   Patient ID: Robin May MRN: [id number], DOB: Jan 04, 1986, 26 y.o. Date of Encounter: 11/02/2012, 10:34 AM  Primary Physician: No primary provider on file.  Chief Complaint:  Chief Complaint  Patient presents with  . Cough    x 4 days green sputum  n/v  due to coughing    robitussin dm not helping   . Anorexia     x 4 days decreased appetite  . Spasms    back due to coughing     HPI: 26 y.o. year old female presents with a 5 day history of nasal congestion, post nasal drip, sore throat, and cough. Mild sinus pressure. Afebrile. No chills. Nasal congestion thick and green/yellow. Cough is productive of green/yellow sputum and not associated with time of day. Ears feel full, leading to sensation of muffled hearing. Has tried OTC cold preps without success. No GI complaints. Coughs until she gags.  No sick contacts, recent antibiotics, or recent travels.   No leg trauma, sedentary periods, h/o cancer, or tobacco use.  Past Medical History  Diagnosis Date  . Hypertension   . Anginal pain 08/18/2012    "mild"  . Shortness of breath 08/19/2012    "w/exertion and w/lying down"     Home Meds: Prior to Admission medications   Medication Sig Start Date End Date Taking? Authorizing Provider  amLODipine (NORVASC) 10 MG tablet Take 1 tablet (10 mg total) by mouth daily. 08/20/12 08/20/13 Yes Pamella Pert, MD  carvedilol (COREG) 25 MG tablet Take 25 mg by mouth daily. Pt taking 1 1/2 tablets qd 08/20/12 08/20/13 Yes Pamella Pert, MD  valsartan-hydrochlorothiazide (DIOVAN-HCT) 320-25 MG per tablet Take 1 tablet by mouth daily.   Yes Historical Provider, MD  acetaminophen (TYLENOL) 325 MG tablet Take 2 tablets (650 mg total) by mouth every 4 (four) hours as needed. 08/20/12 08/20/13  Pamella Pert, MD  Cyanocobalamin (VITAMIN B 12 PO) Take 1 tablet by mouth daily.    Historical Provider, MD  hydrochlorothiazide (HYDRODIURIL) 25 MG tablet Take 1 tablet (25 mg total) by  mouth every morning. 08/20/12 08/20/13  Pamella Pert, MD  lisinopril (PRINIVIL,ZESTRIL) 20 MG tablet Take 1 tablet (20 mg total) by mouth daily. 08/20/12 08/20/13  Pamella Pert, MD    Allergies: No Known Allergies  History   Social History  . Marital Status: Single    Spouse Name: N/A    Number of Children: N/A  . Years of Education: N/A   Occupational History  . Not on file.   Social History Main Topics  . Smoking status: Former Smoker    Types: Cigars    Quit date: 12/31/2005  . Smokeless tobacco: Never Used     Comment: 08/19/2012 "smoked black and milds"  . Alcohol Use: 0.6 oz/week    1 Glasses of wine per week  . Drug Use: No  . Sexually Active: Not Currently     Comment: Last sexual activity 02/2012.   Other Topics Concern  . Not on file   Social History Narrative   Marital status: single.Lives with: motherChildren: noneEmployment: works at [employer] in 26year old class since 2008.Tobacco abuse: noneAlcohol: 1 glass of wine weeklyDrugs: noneExercise: gym three days per week (elliptical, treadmill, weights).     Review of Systems: Constitutional: negative for chills, fever, night sweats or weight changes Cardiovascular: negative for chest pain or palpitations Respiratory: negative for hemoptysis, wheezing, or shortness of breath Abdominal: negative for abdominal pain, nausea, vomiting or diarrhea  Dermatological: negative for rash Neurologic: negative for headache   Physical Exam: Blood pressure 89/60, pulse 89, temperature 99.9 F (37.7 C), temperature source Oral, resp. rate 16, height 5\' 1"  (1.549 m), weight 233 lb 3.2 oz (105.779 kg), last menstrual period 11/02/2012, SpO2 98.00%., Body mass index is 44.06 kg/(m^2). General: Well developed, well nourished, in no acute distress. Head: Normocephalic, atraumatic, eyes without discharge, sclera non-icteric, nares are congested. Bilateral auditory canals clear, TM's are without perforation, pearly grey with  reflective cone of light bilaterally. No sinus TTP. Oral cavity moist, dentition normal. Posterior pharynx with post nasal drip and mild erythema. No peritonsillar abscess or tonsillar exudate. Neck: Supple. No thyromegaly. Full ROM. No lymphadenopathy. Lungs: Coarse breath sounds bilaterally with rhonchi. Breathing is unlabored.  Heart: RRR with S1 S2. No murmurs, rubs, or gallops appreciated. Msk:  Strength and tone normal for age. Extremities: No clubbing or cyanosis. No edema. Neuro: Alert and oriented X 3. Moves all extremities spontaneously. CNII-XII grossly in tact. Psych:  Responds to questions appropriately with a normal affect.   Labs:   ASSESSMENT AND PLAN:  26 y.o. year old female with bronchitis. 1. Bronchitis  azithromycin (ZITHROMAX Z-PAK) 250 MG tablet, mometasone-formoterol (DULERA) 200-5 MCG/ACT AERO, HYDROcodone-homatropine (HYCODAN) 5-1.5 MG/5ML syrup   -Tylenol/Motrin prn -Rest/fluids -RTC precautions -RTC 3-5 days if no improvement  Signed, Elvina Sidle, MD 11/02/2012 10:34 AM

## 2012-11-02 NOTE — Patient Instructions (Signed)

## 2013-10-17 IMAGING — CR DG CHEST 2V
2 series · 2 of 2 positions shown · non-contrast
Comparison: None.

CLINICAL DATA: Wheezing, cough

CHEST - 2 VIEW

[PA]
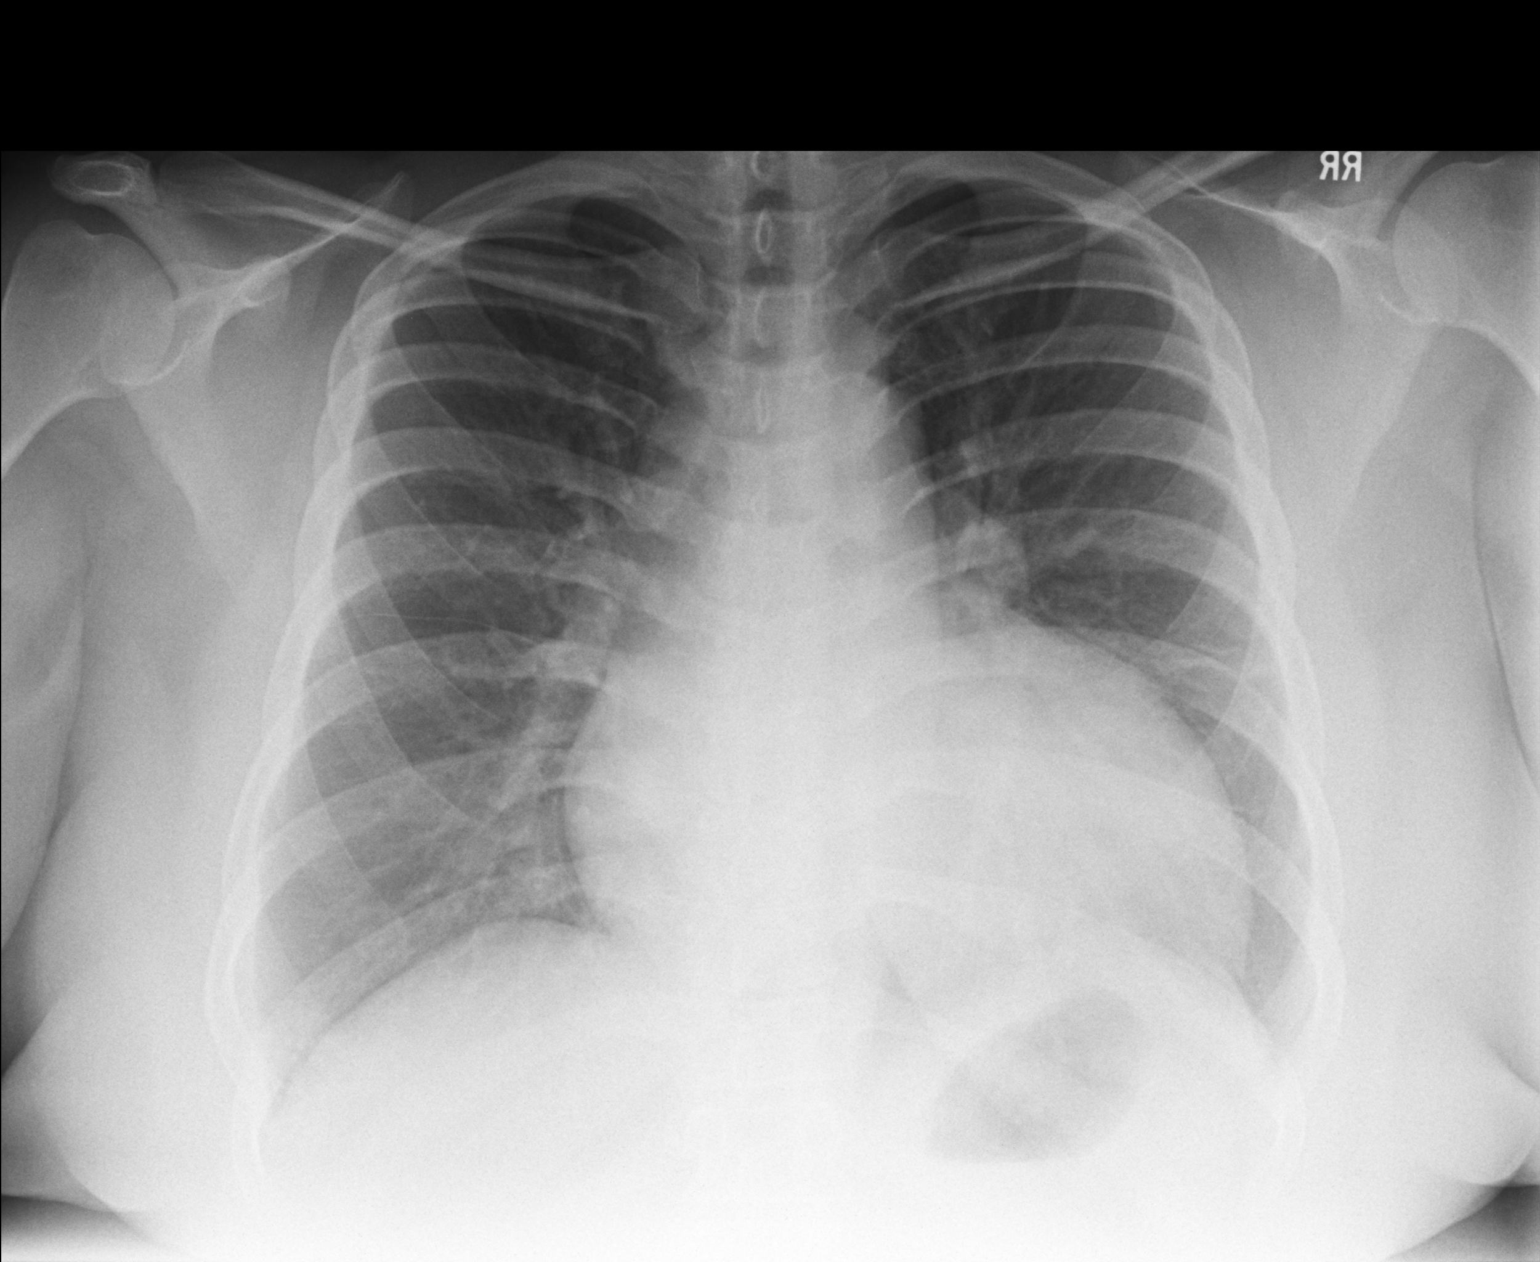

[lateral]
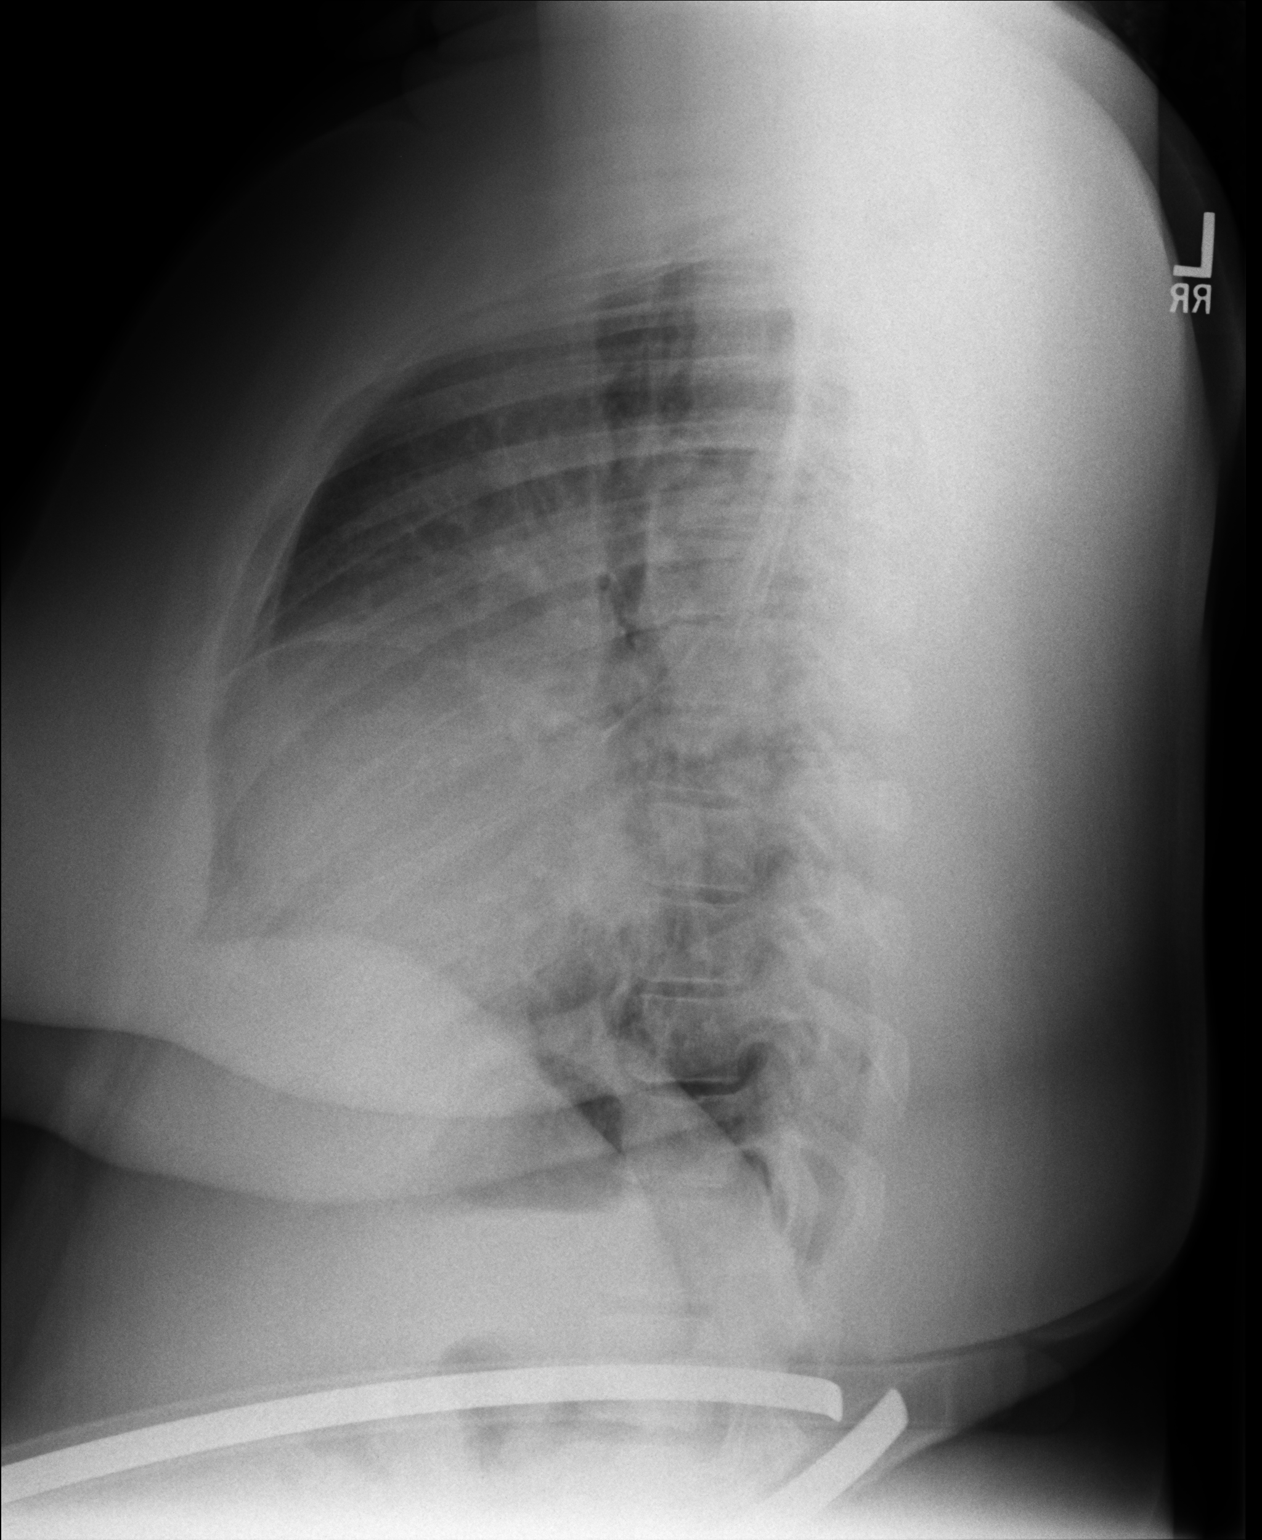

[2 of 2 positions shown; findings below may reference images not displayed]

FINDINGS: Suspected retrocardiac opacity, suspicious for pneumonia.
No pleural effusion or pneumothorax.

Mild cardiomegaly.

Visualized osseous structures are within normal limits.
IMPRESSION: Suspected retrocardiac opacity, suspicious for left lower lobe
pneumonia.  Follow-up chest radiographs are suggested to document
resolution.

Mild cardiomegaly.

These results will be called to the ordering clinician or
representative by the Radiologist Assistant, and communication
documented in the PACS Dashboard.

Clinically significant discrepancy from primary report, if
provided: Suspected left lower lobe pneumonia

## 2014-01-15 ENCOUNTER — Encounter: Payer: Self-pay | Admitting: Family Medicine

## 2014-01-15 ENCOUNTER — Ambulatory Visit (INDEPENDENT_AMBULATORY_CARE_PROVIDER_SITE_OTHER): Payer: No Typology Code available for payment source | Admitting: Family Medicine

## 2014-01-15 VITALS — BP 132/96 | HR 64 | Temp 99.3°F | Resp 16 | Ht 62.5 in | Wt 252.2 lb

## 2014-01-15 DIAGNOSIS — I110 Hypertensive heart disease with heart failure: Secondary | ICD-10-CM

## 2014-01-15 DIAGNOSIS — J309 Allergic rhinitis, unspecified: Secondary | ICD-10-CM

## 2014-01-15 DIAGNOSIS — J3089 Other allergic rhinitis: Secondary | ICD-10-CM

## 2014-01-15 DIAGNOSIS — I509 Heart failure, unspecified: Secondary | ICD-10-CM

## 2014-01-15 LAB — COMPLETE METABOLIC PANEL WITH GFR
ALBUMIN: 4.2 g/dL (ref 3.5–5.2)
ALT: 35 U/L (ref 0–35)
AST: 18 U/L (ref 0–37)
Alkaline Phosphatase: 36 U/L — ABNORMAL LOW (ref 39–117)
BUN: 11 mg/dL (ref 6–23)
CALCIUM: 10.1 mg/dL (ref 8.4–10.5)
CHLORIDE: 102 meq/L (ref 96–112)
CO2: 26 meq/L (ref 19–32)
Creat: 0.93 mg/dL (ref 0.50–1.10)
GFR, EST NON AFRICAN AMERICAN: 85 mL/min
GLUCOSE: 88 mg/dL (ref 70–99)
Potassium: 4.4 mEq/L (ref 3.5–5.3)
SODIUM: 135 meq/L (ref 135–145)
TOTAL PROTEIN: 7.3 g/dL (ref 6.0–8.3)
Total Bilirubin: 0.7 mg/dL (ref 0.3–1.2)

## 2014-01-15 LAB — LIPID PANEL
Cholesterol: 169 mg/dL (ref 0–200)
HDL: 32 mg/dL — ABNORMAL LOW (ref >39)
LDL CALC: 114 mg/dL — AB (ref 0–99)
Total CHOL/HDL Ratio: 5.3 Ratio
Triglycerides: 113 mg/dL (ref <150)
VLDL: 23 mg/dL (ref 0–40)

## 2014-01-15 LAB — TSH: TSH: 1.463 u[IU]/mL (ref 0.350–4.500)

## 2014-01-15 LAB — T4, FREE: Free T4: 1.33 ng/dL (ref 0.80–1.80)

## 2014-01-15 MED ORDER — SPIRONOLACTONE 50 MG PO TABS: 50.0000 mg | ORAL_TABLET | Freq: Every day | ORAL | Status: DC

## 2014-01-15 MED ORDER — MONTELUKAST SODIUM 10 MG PO TABS: 10.0000 mg | ORAL_TABLET | Freq: Every day | ORAL | Status: DC

## 2014-01-15 MED ORDER — VALSARTAN-HYDROCHLOROTHIAZIDE 320-25 MG PO TABS: 1.0000 | ORAL_TABLET | Freq: Every day | ORAL | Status: DC

## 2014-01-15 NOTE — Patient Instructions (Signed)
Allergic Rhinitis Allergic rhinitis is when the mucous membranes in the nose respond to allergens. Allergens are particles in the air that cause your body to have an allergic reaction. This causes you to release allergic antibodies. Through a chain of events, these eventually cause you to release histamine into the blood stream. Although meant to protect the body, it is this release of histamine that causes your discomfort, such as frequent sneezing, congestion, and an itchy, runny nose.  CAUSES  Seasonal allergic rhinitis (hay fever) is caused by pollen allergens that may come from grasses, trees, and weeds. Year-round allergic rhinitis (perennial allergic rhinitis) is caused by allergens such as house dust mites, pet dander, and mold spores.  SYMPTOMS   Nasal stuffiness (congestion).  Itchy, runny nose with sneezing and tearing of the eyes. DIAGNOSIS  Your health care provider can help you determine the allergen or allergens that trigger your symptoms. If you and your health care provider are unable to determine the allergen, skin or blood testing may be used. TREATMENT  Allergic Rhinitis does not have a cure, but it can be controlled by:  Medicines and allergy shots (immunotherapy).  Avoiding the allergen. Hay fever may often be treated with antihistamines in pill or nasal spray forms. Antihistamines block the effects of histamine. There are over-the-counter medicines that may help with nasal congestion and swelling around the eyes. Check with your health care provider before taking or giving this medicine.  If avoiding the allergen or the medicine prescribed do not work, there are many new medicines your health care provider can prescribe. Stronger medicine may be used if initial measures are ineffective. Desensitizing injections can be used if medicine and avoidance does not work. Desensitization is when a patient is given ongoing shots until the body becomes less sensitive to the allergen.  Make sure you follow up with your health care provider if problems continue. HOME CARE INSTRUCTIONS It is not possible to completely avoid allergens, but you can reduce your symptoms by taking steps to limit your exposure to them. It helps to know exactly what you are allergic to so that you can avoid your specific triggers. SEEK MEDICAL CARE IF:   You have a fever.  You develop a cough that does not stop easily (persistent).  You have shortness of breath.  You start wheezing.  Symptoms interfere with normal daily activities. Document Released: 09/11/2001 Document Revised: 10/07/2013 Document Reviewed: 08/24/2013 [facility] CenterExit[facility] Patient Information 2014 [place]Care, [place].   Get over-the-counter Zyrtec and take that medication daily. I have prescribed Singulair (Montelukast) which is for chronic allergies also. Take this medication at bedtime. Use the AYR nasal mist to clear your nasal passages.

## 2014-01-16 NOTE — Progress Notes (Signed)
Quick Note:  Please advise pt regarding following labs...  All labs are normal. HDL ("good") cholesterol is a little below normal. Regular exercise and healthier nutrition will help raise this number for better "heart health".   The Mediterranean Diet is a great guide for healthier eating.  Copy to pt.   ______

## 2014-01-19 NOTE — Progress Notes (Signed)
S:  This 28 y.o. AA female has HTN w/ CHF, controlled on current medications. She has had full evaluation by Dr. Yates DecampJay Ganji in 2013. She remains compliant w/ medications but has been unsuccessful w/ weight reduction. Pt denies fatigue, vision disturbances, CP or tightness, palpitations, abd pain, numbness, weakness or syncope. She has had mild HA and dizziness.  Pt c/o sore throat w/ PND. She denies fever/ chills, cough, enlarged lymph nodes, rhinnorhea or sinus pressure or pain. Pt uses Dulera which was prescribed in 2013. Not sure if she is rinsing mouth and gargling after use. Work environment contributes to symptoms of congestion and sore throat. She does not have pets and does not report any food allergies.  Patient Active Problem List   Diagnosis Date Noted  . Acute diastolic congestive heart failure, NYHA class 3 08/20/2012  . Hypertensive CHF (congestive heart failure) 08/19/2012  . Obesity, morbid 08/19/2012   PMHx, Soc and Fam Hx reviewed.  Medications reconciled.  ROS: As per HPI.  O: Filed Vitals:   01/15/14 1122  BP: 132/96  Pulse: 64  Temp: 99.3 F (37.4 C)  Resp: 16   GEN: In NAD; WN,WD. Pt is morbidly obese.  HEENT: [place]/AT; EOMI w/ clear conj/sclerae. EACs/TMs normal. Nasal mucosa erythematous w/o rhinorrhea. Dentition good. Post ph erythematous w/o exudate or lesions. NT sinuses. NECK: Supple w/o LAN or TMG. COR: RRR. Normal S1 and S2 w/o m/g/r. LUNGS: CTA; normal resp rate w/o wheezes, rhonchi or rales. SKIN: W&D; intact w/o diaphoresis, erythema or rashes. NEURO: A&O x 3; CNs intact. Nonfocal.  A/P: Hypertensive CHF (congestive heart failure) - Resume all current medications. Encouraged weight reduction. Plan: T4, free, TSH, COMPLETE METABOLIC PANEL WITH GFR, Lipid panel  Perennial allergic rhinitis- Trial Montelukast 10 mg 1 tab at bedtime; OTC saline nasal spray- AYR brand. Humidification at home may be effective.  Meds ordered this encounter  Medications  .  DISCONTD: spironolactone (ALDACTONE) 50 MG tablet    Sig: Take 50 mg by mouth daily.  Marland Kitchen. spironolactone (ALDACTONE) 50 MG tablet    Sig: Take 1 tablet (50 mg total) by mouth daily.    Dispense:  30 tablet    Refill:  5  . valsartan-hydrochlorothiazide (DIOVAN-HCT) 320-25 MG per tablet    Sig: Take 1 tablet by mouth daily.    Dispense:  30 tablet    Refill:  5  . montelukast (SINGULAIR) 10 MG tablet    Sig: Take 1 tablet (10 mg total) by mouth at bedtime.    Dispense:  30 tablet    Refill:  5

## 2014-04-08 ENCOUNTER — Other Ambulatory Visit: Payer: Self-pay | Admitting: Family Medicine

## 2014-04-08 NOTE — Telephone Encounter (Signed)
Dr Audria NineMcPherson, see comment on RF request. Pharmacist is asking for change to less expensive alternative.

## 2014-04-11 MED ORDER — LOSARTAN POTASSIUM-HCTZ 50-12.5 MG PO TABS: 1.0000 | ORAL_TABLET | Freq: Every day | ORAL | Status: DC

## 2014-04-11 NOTE — Telephone Encounter (Signed)
Hyzaar (generic) 50-12.5 MG prescribed. Pt has appt 04/16/14 so we can see if this med controls her pressure and the cost is reasonable.

## 2014-04-16 ENCOUNTER — Encounter: Payer: Self-pay | Admitting: Family Medicine

## 2014-04-16 ENCOUNTER — Ambulatory Visit (INDEPENDENT_AMBULATORY_CARE_PROVIDER_SITE_OTHER): Payer: No Typology Code available for payment source | Admitting: Family Medicine

## 2014-04-16 VITALS — BP 110/90 | HR 77 | Temp 99.1°F | Resp 16 | Ht 63.0 in | Wt 255.4 lb

## 2014-04-16 DIAGNOSIS — I509 Heart failure, unspecified: Secondary | ICD-10-CM

## 2014-04-16 DIAGNOSIS — I110 Hypertensive heart disease with heart failure: Secondary | ICD-10-CM

## 2014-04-16 MED ORDER — AMLODIPINE BESYLATE 10 MG PO TABS: 10.0000 mg | ORAL_TABLET | Freq: Every day | ORAL | Status: DC

## 2014-04-16 NOTE — Progress Notes (Signed)
S:  This 28 y.o. AA female has Hypertensive CHF; she feels good and is working on weight reduction. She is compliant w/ all medications w/o adverse effects. She reports home BP= 111/78. Pt denies diaphoresis, fatigue, CP or tightness, palpitations, SOB or DOE, orthopnea, edema. HA, dizziness, numbness or syncope.   Patient Active Problem List   Diagnosis Date Noted  . Acute diastolic congestive heart failure, NYHA class 3 08/20/2012  . Hypertensive CHF (congestive heart failure) 08/19/2012  . Obesity, morbid 08/19/2012   Prior to Admission medications   Medication Sig Start Date End Date Taking? Authorizing Provider  Cyanocobalamin (VITAMIN B 12 PO) Take 1 tablet by mouth daily.   Yes Historical Provider, MD  losartan-hydrochlorothiazide (HYZAAR) 50-12.5 MG per tablet Take 1 tablet by mouth daily.   Yes Maurice MarchBarbara B Deveney Bayon, MD  mometasone-formoterol [facility](DULERA) 200-5 MCG/ACT AERO Inhale 2 puffs into the lungs 2 (two) times daily. 11/02/12  Yes Elvina SidleKurt Lauenstein, MD  montelukast (SINGULAIR) 10 MG tablet Take 1 tablet (10 mg total) by mouth at bedtime. 01/15/14  Yes Maurice MarchBarbara B Chade Pitner, MD  spironolactone (ALDACTONE) 50 MG tablet Take 1 tablet (50 mg total) by mouth daily. 01/15/14  Yes Maurice MarchBarbara B Samanta Gal, MD  amLODipine (NORVASC) 10 MG tablet Take 1 tablet (10 mg total) by mouth daily. 04/16/14 04/16/15  Maurice MarchBarbara B Letrell Attwood, MD  carvedilol (COREG) 25 MG tablet Take 25 mg by mouth daily. Pt taking 1 1/2 tablets qd 08/20/12 08/20/13  Pamella PertJagadeesh R Ganji, MD   PMHx, Surg Hx, Soc and Fam Hx reviewed.  ROS: As per HPI.  O: Filed Vitals:   04/16/14 1048  BP: 110/90  Pulse: 77  Temp: 99.1 F (37.3 C)  Resp: 16   GEN: In NAD; WN,WD.  HENT: [place]/AT; EOMI w/ clear conj/sclerae. Otherwise unremarkable. COR: RRR. No edema. LUNGS: Normal resp rate and effort. SKIN: W&D; intact w/o diaphoresis or erythema. MS: MAEs; no c/c/e. NEURO: A&O x 3; CNs intact. Nonfocal.  A/P: Hypertensive CHF (congestive heart  failure)- Continue current medications. Encouraged pt to focus on better nutrition and weight reduction.  Meds ordered this encounter  Medications  . amLODipine (NORVASC) 10 MG tablet    Sig: Take 1 tablet (10 mg total) by mouth daily.    Dispense:  90 tablet    Refill:  1

## 2014-04-16 NOTE — Patient Instructions (Signed)
Vitamin D Deficiency Vitamin D is an important vitamin that your body needs. Having too little of it in your body is called a deficiency. A very bad deficiency can make your bones soft and can cause a condition called rickets.  Vitamin D is important to your body for different reasons, such as:   It helps your body absorb 2 minerals called calcium and phosphorus.  It helps make your bones healthy.  It may prevent some diseases, such as diabetes and multiple sclerosis.  It helps your muscles and heart. You can get vitamin D in several ways. It is a natural part of some foods. The vitamin is also added to some dairy products and cereals. Some people take vitamin D supplements. Also, your body makes vitamin D when you are in the sun. It changes the sun's rays into a form of the vitamin that your body can use. CAUSES   Not eating enough foods that contain vitamin D.  Not getting enough sunlight.  Having certain digestive system diseases that make it hard to absorb vitamin D. These diseases include Crohn's disease, chronic pancreatitis, and cystic fibrosis.  Having a surgery in which part of the stomach or small intestine is removed.  Being obese. Fat cells pull vitamin D out of your blood. That means that obese people may not have enough vitamin D left in their blood and in other body tissues.  Having chronic kidney or liver disease. RISK FACTORS Risk factors are things that make you more likely to develop a vitamin D deficiency. They include:  Being older.  Not being able to get outside very much.  Living in a nursing home.  Having had broken bones.  Having weak or thin bones (osteoporosis).  Having a disease or condition that changes how your body absorbs vitamin D.  Having dark skin.  Some medicines such as seizure medicines or steroids.  Being overweight or obese. SYMPTOMS Mild cases of vitamin D deficiency may not have any symptoms. If you have a very bad case, symptoms  may include:  Bone pain.  Muscle pain.  Falling often.  Broken bones caused by a minor injury, due to osteoporosis. DIAGNOSIS A blood test is the best way to tell if you have a vitamin D deficiency. TREATMENT Vitamin D deficiency can be treated in different ways. Treatment for vitamin D deficiency depends on what is causing it. Options include:  Taking vitamin D supplements.  Taking a calcium supplement. Your caregiver will suggest what dose is best for you. HOME CARE INSTRUCTIONS  Take any supplements that your caregiver prescribes. Follow the directions carefully. Take only the suggested amount.  Have your blood tested 2 months after you start taking supplements.  Eat foods that contain vitamin D. Healthy choices include:  Fortified dairy products, cereals, or juices. Fortified means vitamin D has been added to the food. Check the label on the package to be sure.  Fatty fish like salmon or trout.  Eggs.  Oysters.  Do not use a tanning bed.  Keep your weight at a healthy level. Lose weight if you need to.  Keep all follow-up appointments. Your caregiver will need to perform blood tests to make sure your vitamin D deficiency is going away. SEEK MEDICAL CARE IF:  You have any questions about your treatment.  You continue to have symptoms of vitamin D deficiency.  You have nausea or vomiting.  You are constipated.  You feel confused.  You have severe abdominal or back pain. MAKE   SURE YOU:  Understand these instructions.  Will watch your condition.  Will get help right away if you are not doing well or get worse. Document Released: 03/10/2012 Document Revised: 04/13/2013 Document Reviewed: 03/10/2012 [facility]Exit[facility] Patient Information 2014 [place]ExitCare, [place].   Get an over-the- counter Vitamin D3 supplement 2000 units and take it most days of the week. Get a multivitamin Ashby Dawes([employer] [facility]) and take it daily with a meal.

## 2014-04-18 ENCOUNTER — Encounter: Payer: Self-pay | Admitting: Family Medicine

## 2014-06-14 ENCOUNTER — Other Ambulatory Visit: Payer: Self-pay | Admitting: Family Medicine

## 2014-06-15 ENCOUNTER — Telehealth: Payer: Self-pay

## 2014-06-15 NOTE — Telephone Encounter (Signed)
Patient states the pharmacy told her she no longer has any refills remaining on losartan-hydrochlorothiazide (HYZAAR) 50-12.5 MG per tablet   [phone]

## 2014-06-16 MED ORDER — LOSARTAN POTASSIUM-HCTZ 50-12.5 MG PO TABS: 1.0000 | ORAL_TABLET | Freq: Every day | ORAL | Status: DC

## 2014-06-16 NOTE — Telephone Encounter (Signed)
RFs should have been left, but sent in RFs and notified pt.

## 2014-06-28 ENCOUNTER — Ambulatory Visit (INDEPENDENT_AMBULATORY_CARE_PROVIDER_SITE_OTHER): Payer: No Typology Code available for payment source | Admitting: Emergency Medicine

## 2014-06-28 ENCOUNTER — Ambulatory Visit: Payer: No Typology Code available for payment source

## 2014-06-28 VITALS — BP 129/83 | HR 76 | Temp 97.5°F | Resp 18 | Ht 62.5 in | Wt 260.0 lb

## 2014-06-28 DIAGNOSIS — M549 Dorsalgia, unspecified: Secondary | ICD-10-CM

## 2014-06-28 DIAGNOSIS — S139XXA Sprain of joints and ligaments of unspecified parts of neck, initial encounter: Secondary | ICD-10-CM

## 2014-06-28 MED ORDER — CYCLOBENZAPRINE HCL 10 MG PO TABS: 10.0000 mg | ORAL_TABLET | Freq: Three times a day (TID) | ORAL | Status: DC | PRN

## 2014-06-28 MED ORDER — NAPROXEN SODIUM 550 MG PO TABS: 550.0000 mg | ORAL_TABLET | Freq: Two times a day (BID) | ORAL | Status: DC

## 2014-06-28 NOTE — Patient Instructions (Signed)

## 2014-06-28 NOTE — Progress Notes (Signed)
[facility] [address], [place] [id number] [phone]  Date:  06/28/2014   Name:  Robin May   DOB:  02-Jun-1986   MRN:  [id number]  PCP:  No primary Gisella Alwine on file.    Chief Complaint: Back Injury and Back Pain   History of Present Illness:  Robin May is a 28 y.o. very pleasant female patient who presents with the following:  [place] beach [place].  Landed on her back and has pain across her shoulders that is not radiating.  No associated neuro symptoms.  Denies low back pain, LOC or head injury. No neuro or visual symptoms.  No nausea or vomiting.  No improvement with over the counter medications or other home remedies. Denies other complaint or health concern today.   Patient Active Problem List   Diagnosis Date Noted  . Acute diastolic congestive heart failure, NYHA class 3 08/20/2012  . Hypertensive CHF (congestive heart failure) 08/19/2012  . Obesity, morbid 08/19/2012    Past Medical History  Diagnosis Date  . Hypertension   . Anginal pain 08/18/2012    "mild"  . Shortness of breath 08/19/2012    "w/exertion and w/lying down"    Past Surgical History  Procedure Laterality Date  . No past surgeries      History  Substance Use Topics  . Smoking status: Former Smoker    Types: Cigars    Quit date: 12/31/2005  . Smokeless tobacco: Never Used     Comment: 08/19/2012 "smoked black and milds"  . Alcohol Use: 0.6 oz/week    1 Glasses of wine per week    Family History  Problem Relation Age of Onset  . Hypertension Mother   . Hypertension Maternal Grandmother   . Hypertension Maternal Grandfather   . Hypertension Paternal Grandmother   . Hypertension Paternal Grandfather     No Known Allergies  Medication list has been reviewed and updated.  Current Outpatient Prescriptions on File Prior to Visit  Medication Sig Dispense Refill  . amLODipine (NORVASC) 10 MG tablet Take 1 tablet (10 mg  total) by mouth daily.  90 tablet  1  . Cyanocobalamin (VITAMIN B 12 PO) Take 1 tablet by mouth daily.      Marland Kitchen. losartan-hydrochlorothiazide (HYZAAR) 50-12.5 MG per tablet Take 1 tablet by mouth daily.  30 tablet  3  . montelukast (SINGULAIR) 10 MG tablet Take 1 tablet (10 mg total) by mouth at bedtime.  30 tablet  5  . spironolactone (ALDACTONE) 50 MG tablet Take 1 tablet (50 mg total) by mouth daily.  30 tablet  5  . carvedilol (COREG) 25 MG tablet Take 25 mg by mouth daily. Pt taking 1 1/2 tablets qd      . mometasone-formoterol (DULERA) 200-5 MCG/ACT AERO Inhale 2 puffs into the lungs 2 (two) times daily.  1 Inhaler  3   No current facility-administered medications on file prior to visit.    Review of Systems:  As per HPI, otherwise negative.    Physical Examination: Filed Vitals:   06/28/14 1204  BP: 129/83  Pulse: 76  Temp: 97.5 F (36.4 C)  Resp: 18   Filed Vitals:   06/28/14 1204  Height: 5' 2.5" (1.588 m)  Weight: 260 lb (117.935 kg)   Body mass index is 46.77 kg/(m^2). Ideal Body Weight: Weight in (lb) to have BMI = 25: 138.6   GEN: morbidly obese, NAD, Non-toxic, Alert &  Oriented x 3 HEENT: Atraumatic, Normocephalic.  Ears and Nose: No external deformity. EXTR: No clubbing/cyanosis/edema NEURO: Normal gait.  PSYCH: Normally interactive. Conversant. Not depressed or anxious appearing.  Calm demeanor.  Neck:  Tender across shoulders.  No ecchymosis or abrasions.  Neuro intact Back not tender Assessment and Plan: Cervical strain Anaprox Flexeril  Signed,  Phillips OdorJeffery Anderson, MD   [facility] reading (PRIMARY) by  Dr. Dareen PianoAnderson.  No acute injury.

## 2014-08-30 DIAGNOSIS — Z0271 Encounter for disability determination: Secondary | ICD-10-CM

## 2014-09-22 ENCOUNTER — Ambulatory Visit (INDEPENDENT_AMBULATORY_CARE_PROVIDER_SITE_OTHER): Payer: No Typology Code available for payment source | Admitting: Family Medicine

## 2014-09-22 ENCOUNTER — Telehealth: Payer: Self-pay | Admitting: Family Medicine

## 2014-09-22 ENCOUNTER — Encounter: Payer: Self-pay | Admitting: Family Medicine

## 2014-09-22 ENCOUNTER — Other Ambulatory Visit: Payer: Self-pay

## 2014-09-22 VITALS — BP 115/76 | HR 75 | Temp 98.0°F | Resp 16 | Ht 63.5 in | Wt 259.0 lb

## 2014-09-22 DIAGNOSIS — E789 Disorder of lipoprotein metabolism, unspecified: Secondary | ICD-10-CM

## 2014-09-22 DIAGNOSIS — J3089 Other allergic rhinitis: Secondary | ICD-10-CM | POA: Insufficient documentation

## 2014-09-22 DIAGNOSIS — I509 Heart failure, unspecified: Secondary | ICD-10-CM

## 2014-09-22 DIAGNOSIS — Z01419 Encounter for gynecological examination (general) (routine) without abnormal findings: Secondary | ICD-10-CM

## 2014-09-22 DIAGNOSIS — Z124 Encounter for screening for malignant neoplasm of cervix: Secondary | ICD-10-CM

## 2014-09-22 DIAGNOSIS — R748 Abnormal levels of other serum enzymes: Secondary | ICD-10-CM

## 2014-09-22 DIAGNOSIS — I110 Hypertensive heart disease with heart failure: Secondary | ICD-10-CM

## 2014-09-22 DIAGNOSIS — J309 Allergic rhinitis, unspecified: Secondary | ICD-10-CM

## 2014-09-22 DIAGNOSIS — Z0000 Encounter for general adult medical examination without abnormal findings: Secondary | ICD-10-CM

## 2014-09-22 LAB — BASIC METABOLIC PANEL
BUN: 15 mg/dL (ref 6–23)
CHLORIDE: 100 meq/L (ref 96–112)
CO2: 26 mEq/L (ref 19–32)
Calcium: 9.6 mg/dL (ref 8.4–10.5)
Creat: 0.96 mg/dL (ref 0.50–1.10)
Glucose, Bld: 98 mg/dL (ref 70–99)
POTASSIUM: 4 meq/L (ref 3.5–5.3)
SODIUM: 135 meq/L (ref 135–145)

## 2014-09-22 LAB — LIPID PANEL
CHOL/HDL RATIO: 5 ratio
Cholesterol: 190 mg/dL (ref 0–200)
HDL: 38 mg/dL — AB (ref >39)
LDL CALC: 130 mg/dL — AB (ref 0–99)
Triglycerides: 111 mg/dL (ref <150)
VLDL: 22 mg/dL (ref 0–40)

## 2014-09-22 LAB — CBC WITH DIFFERENTIAL/PLATELET
Basophils Absolute: 0.1 10*3/uL (ref 0.0–0.1)
Basophils Relative: 1 % (ref 0–1)
EOS ABS: 0.1 10*3/uL (ref 0.0–0.7)
EOS PCT: 2 % (ref 0–5)
HEMATOCRIT: 43.7 % (ref 36.0–46.0)
Hemoglobin: 15 g/dL (ref 12.0–15.0)
LYMPHS ABS: 2.3 10*3/uL (ref 0.7–4.0)
LYMPHS PCT: 38 % (ref 12–46)
MCH: 27.3 pg (ref 26.0–34.0)
MCHC: 34.3 g/dL (ref 30.0–36.0)
MCV: 79.5 fL (ref 78.0–100.0)
MONO ABS: 0.4 10*3/uL (ref 0.1–1.0)
Monocytes Relative: 6 % (ref 3–12)
Neutro Abs: 3.2 10*3/uL (ref 1.7–7.7)
Neutrophils Relative %: 53 % (ref 43–77)
PLATELETS: 353 10*3/uL (ref 150–400)
RBC: 5.5 MIL/uL — AB (ref 3.87–5.11)
RDW: 13.5 % (ref 11.5–15.5)
WBC: 6 10*3/uL (ref 4.0–10.5)

## 2014-09-22 MED ORDER — CARVEDILOL 25 MG PO TABS: ORAL_TABLET | ORAL | Status: DC

## 2014-09-22 MED ORDER — AMLODIPINE BESYLATE 10 MG PO TABS: 10.0000 mg | ORAL_TABLET | Freq: Every day | ORAL | Status: DC

## 2014-09-22 MED ORDER — CYCLOBENZAPRINE HCL 10 MG PO TABS: 10.0000 mg | ORAL_TABLET | Freq: Three times a day (TID) | ORAL | Status: DC | PRN

## 2014-09-22 MED ORDER — LOSARTAN POTASSIUM-HCTZ 50-12.5 MG PO TABS: 1.0000 | ORAL_TABLET | Freq: Every day | ORAL | Status: DC

## 2014-09-22 MED ORDER — CARVEDILOL 25 MG PO TABS: 25.0000 mg | ORAL_TABLET | Freq: Every day | ORAL | Status: DC

## 2014-09-22 MED ORDER — NAPROXEN SODIUM 550 MG PO TABS: 550.0000 mg | ORAL_TABLET | Freq: Two times a day (BID) | ORAL | Status: DC

## 2014-09-22 MED ORDER — MONTELUKAST SODIUM 10 MG PO TABS: 10.0000 mg | ORAL_TABLET | Freq: Every day | ORAL | Status: DC

## 2014-09-22 MED ORDER — SPIRONOLACTONE 50 MG PO TABS: 50.0000 mg | ORAL_TABLET | Freq: Every day | ORAL | Status: DC

## 2014-09-22 NOTE — Telephone Encounter (Signed)
I phoned the pharmacy to clarify Carvedilol dosing which is 1.5 tabs daily

## 2014-09-22 NOTE — Telephone Encounter (Signed)
I phoned pt to clarify Carvedilol medication dose; this med was originally prescribed by Dr. Jacinto Halim. Current formulation and dosing not clear. Asked pt to check this information and call us back.

## 2014-09-22 NOTE — Telephone Encounter (Signed)
[facility] called regarding Carvedilol rx sent in today by Dr. Audria Nine. She said there are two instructions on the rx. One says to take 1 pill daily and the other instruction says to take 1 1/2 pills qd.   [phone]

## 2014-09-22 NOTE — Telephone Encounter (Signed)
Do you want her to take one and 1/2 ? Looks like this has been current dose, pended.

## 2014-09-22 NOTE — Telephone Encounter (Signed)
I spoke with pt and Carvedilol dose is 25 mg 1 1/2 tablets twice a day. This information has been relayed to pharmacy.

## 2014-09-22 NOTE — Progress Notes (Signed)
Subjective:    Patient ID: Robin May, female    DOB: 04/29/1986, 28 y.o.   MRN: [id number]  HPI  This 28 y.o. AA female is here for CPE/PAP. She has Hypertensive CHF, well controlled on current medications. She has fully evaluated by Dr. Jacinto Halim who advised pt that she could follow-up and have medications prescribed here at [facility]. Pt has normal menses and not sexually active x 4 years. Pt is proud of her efforts at weight loss by following the Mediterranean Diet and increasing physical activity.  HCM: PAP ~2- 3 years ago (negative).           IMM- Current; declines Flu vaccine.           Vision- Recent exam; corrective lenses.   Patient Active Problem List   Diagnosis Date Noted  . Low serum high density lipoprotein (HDL) 09/22/2014  . Allergic rhinitis 09/22/2014  . Acute diastolic congestive heart failure, NYHA class 3 08/20/2012  . Hypertensive CHF (congestive heart failure) 08/19/2012  . Obesity, morbid 08/19/2012    Prior to Admission medications   Medication Sig Start Date End Date Taking? Authorizing Provider  amLODipine (NORVASC) 10 MG tablet Take 1 tablet (10 mg total) by mouth daily.   Yes Maurice March, MD  Cyanocobalamin (VITAMIN B 12 PO) Take 1 tablet by mouth daily.   Yes Historical Provider, MD  cyclobenzaprine (FLEXERIL) 10 MG tablet Take 1 tablet (10 mg total) by mouth 3 (three) times daily as needed for muscle spasms.   No- ran out   losartan-hydrochlorothiazide (HYZAAR) 50-12.5 MG per tablet Take 1 tablet by mouth daily.   Yes Maurice March, MD  montelukast (SINGULAIR) 10 MG tablet Take 1 tablet (10 mg total) by mouth at bedtime.   Yes Maurice March, MD  naproxen sodium (ANAPROX DS) 550 MG tablet Take 1 tablet (550 mg total) by mouth 2 (two) times daily with a meal.   Yes Maurice March, MD  spironolactone (ALDACTONE) 50 MG tablet Take 1 tablet (50 mg total) by mouth daily.   Yes Maurice March, MD  carvedilol (COREG) 25 MG tablet Take  one and one half tablets daily 09/22/14   Maurice March, MD    History   Social History  . Marital Status: Single    Spouse Name: N/A    Number of Children: N/A  . Years of Education: N/A   Occupational History  . Not on file.   Social History Main Topics  . Smoking status: Former Smoker    Types: Cigars    Quit date: 12/31/2005  . Smokeless tobacco: Never Used     Comment: 08/19/2012 "smoked black and milds"  . Alcohol Use: 0.6 - 1.2 oz/week    1-2 Glasses of wine per week  . Drug Use: No  . Sexual Activity: Not Currently     Comment: Last sexual activity 02/2012.   Other Topics Concern  . Not on file   Social History Narrative   Marital status: single.   Lives with: mother   Children: none   Employment: works at [employer] in 28year old class since 2008.   Tobacco abuse: none   Alcohol: 1 glass of wine weekly   Drugs: none   Exercise: gym three days per week (elliptical, treadmill, weights).    Family History  Problem Relation Age of Onset  . Hypertension Mother   . Hypertension Maternal Grandmother   . Hypertension Maternal Grandfather   . Hypertension  Paternal Grandmother   . Hypertension Paternal Grandfather      Review of Systems  Constitutional: Positive for unexpected weight change.  HENT: Negative.   Eyes: Negative.   Respiratory: Negative.   Cardiovascular: Negative.   Gastrointestinal: Negative.   Endocrine: Negative.   Genitourinary: Negative.   Musculoskeletal: Positive for back pain.       Fell and re-injured back; having muscle spasms.  Allergic/Immunologic: Negative.   Neurological: Negative.   Hematological: Positive for adenopathy.  Psychiatric/Behavioral: Negative.        Objective:   Physical Exam  Nursing note and vitals reviewed. Constitutional: She is oriented to person, place, and time. Vital signs are normal. She appears well-developed and well-nourished. No distress.  Pt is obese; BMI= 45.16 kg/m2  HENT:  Head:  Normocephalic and atraumatic.  Right Ear: Hearing, tympanic membrane, external ear and ear canal normal.  Left Ear: Hearing, tympanic membrane, external ear and ear canal normal.  Nose: Nose normal. No mucosal edema, rhinorrhea, nasal deformity or septal deviation.  Mouth/Throat: Uvula is midline and mucous membranes are normal. No oral lesions. Normal dentition. No dental caries. Posterior oropharyngeal erythema present. No oropharyngeal exudate.  Eyes: EOM and lids are normal. Pupils are equal, round, and reactive to light. Right conjunctiva is injected. Left conjunctiva is injected. No scleral icterus.  Fundoscopic exam:      The right eye shows no papilledema. The right eye shows red reflex.       The left eye shows no papilledema. The left eye shows red reflex.  Neck: Trachea normal, normal range of motion, full passive range of motion without pain and phonation normal. Neck supple. No spinous process tenderness and no muscular tenderness present. Thyromegaly present. No mass present.  Cardiovascular: Normal rate, regular rhythm, S1 normal, S2 normal and normal pulses.   No extrasystoles are present. PMI is not displaced.  Exam reveals distant heart sounds. Exam reveals no gallop and no friction rub.   No murmur heard. Pulmonary/Chest: Effort normal and breath sounds normal. No respiratory distress. Right breast exhibits no inverted nipple, no mass, no nipple discharge, no skin change and no tenderness. Left breast exhibits no inverted nipple, no mass, no nipple discharge, no skin change and no tenderness. Breasts are symmetrical.  Abdominal: Soft. Bowel sounds are normal. She exhibits no distension, no pulsatile midline mass and no mass. There is no hepatosplenomegaly. There is no tenderness. There is no rigidity, no guarding and no CVA tenderness.  Increased abd girth and adipose tissue make examination on intra-abdominal organs difficult.  Genitourinary: Vagina normal. There is no rash,  tenderness or lesion on the right labia. There is no rash, tenderness or lesion on the left labia. No erythema, tenderness or bleeding around the vagina. No signs of injury around the vagina. No vaginal discharge found.  Musculoskeletal:       Cervical back: Normal.       Thoracic back: Normal.       Lumbar back: She exhibits tenderness and spasm. She exhibits no bony tenderness, no deformity and no pain.  Remainder of exam unremarkable.  Lymphadenopathy:       Head (right side): No submental, no submandibular, no tonsillar, no preauricular, no posterior auricular and no occipital adenopathy present.       Head (left side): No submental, no submandibular, no tonsillar, no preauricular, no posterior auricular and no occipital adenopathy present.    She has no cervical adenopathy.    She has no axillary adenopathy.  Right: No inguinal and no supraclavicular adenopathy present.       Left: No inguinal and no supraclavicular adenopathy present.  Neurological: She is alert and oriented to person, place, and time. She has normal strength. She displays no atrophy. No cranial nerve deficit or sensory deficit. She exhibits normal muscle tone. Coordination and gait normal.  Reflex Scores:      Tricep reflexes are 1+ on the right side and 1+ on the left side.      Bicep reflexes are 1+ on the right side and 1+ on the left side.      Brachioradialis reflexes are 1+ on the right side and 1+ on the left side.      Patellar reflexes are 2+ on the right side and 2+ on the left side. Skin: Skin is warm, dry and intact. No ecchymosis, no lesion and no rash noted. She is not diaphoretic. No cyanosis or erythema. Nails show no clubbing.  Psychiatric: She has a normal mood and affect. Her speech is normal and behavior is normal. Judgment and thought content normal. Cognition and memory are normal.       Assessment & Plan:  Routine general medical examination at a health care facility  Encounter for  cervical Pap smear with pelvic exam - Plan: Pap IG, CT/NG w/ reflex HPV when ASC-U, Basic metabolic panel, CBC with Differential, Lipid panel  Hypertensive CHF (congestive heart failure) - Stable; continue current medications. Plan: Basic metabolic panel, CBC with Differential, Lipid panel  Low serum high density lipoprotein (HDL) - Plan: Basic metabolic panel, CBC with Differential, Lipid panel  Allergic rhinitis, unspecified allergic rhinitis type  Meds ordered this encounter  Medications  . amLODipine (NORVASC) 10 MG tablet    Sig: Take 1 tablet (10 mg total) by mouth daily.    Dispense:  90 tablet    Refill:  3  . cyclobenzaprine (FLEXERIL) 10 MG tablet    Sig: Take 1 tablet (10 mg total) by mouth 3 (three) times daily as needed for muscle spasms.    Dispense:  30 tablet    Refill:  1  . losartan-hydrochlorothiazide (HYZAAR) 50-12.5 MG per tablet    Sig: Take 1 tablet by mouth daily.    Dispense:  30 tablet    Refill:  11  . montelukast (SINGULAIR) 10 MG tablet    Sig: Take 1 tablet (10 mg total) by mouth at bedtime.    Dispense:  30 tablet    Refill:  11  . naproxen sodium (ANAPROX DS) 550 MG tablet    Sig: Take 1 tablet (550 mg total) by mouth 2 (two) times daily with a meal.    Dispense:  60 tablet    Refill:  2  . spironolactone (ALDACTONE) 50 MG tablet    Sig: Take 1 tablet (50 mg total) by mouth daily.    Dispense:  30 tablet    Refill:  11  . carvedilol (COREG) 25 MG tablet    Sig: Take 1 1/2 tablets by mouth twice a day.    Dispense:  100 tablet    Refill:  11

## 2014-09-23 LAB — PAP IG, CT-NG, RFX HPV ASCU
Chlamydia Probe Amp: NEGATIVE
GC PROBE AMP: NEGATIVE

## 2014-09-29 ENCOUNTER — Encounter: Payer: Self-pay | Admitting: *Deleted

## 2014-09-29 NOTE — Progress Notes (Signed)
Quick Note:  Please advise pt regarding following labs... PAP is negative / normal. STD tests (Chlamydia and Gonorrhea) are negative. Blood sugar, salts and kidney function are normal. Blood counts are normal.  Lipid panel shows HDL ("good" cholesterol) is too low and LDL ("bad" cholesterol) is a little high.  Good nutrition and regular physical activity help improve lipid profile. MEDITERRANEAN DIET is a great guide for healthier lifestyle;      Mediterranean Diet  Why follow it? Research shows. Those who follow the Mediterranean diet have a reduced risk of heart disease  The diet is associated with a reduced incidence of Parkinson's and Alzheimer's diseases People following the diet may have longer life expectancies and lower rates of chronic diseases  The Dietary Guidelines for Americans recommends the Mediterranean diet as an eating plan to promote health and prevent disease  What Is the Mediterranean Diet?  Healthy eating plan based on typical foods and recipes of Mediterranean-style cooking The diet is primarily a plant based diet; these foods should make up a majority of meals   Starches - Plant based foods should make up a majority of meals - They are an important sources of vitamins, minerals, energy, antioxidants, and fiber - Choose whole grains, foods high in fiber and minimally processed items  - Typical grain sources include wheat, oats, barley, corn, brown rice, bulgar, farro, millet, polenta, couscous  - Various types of beans include chickpeas, lentils, fava beans, black beans, white beans  Fruits Veggies - Large quantities of antioxidant rich fruits & veggies; 6 or more servings  - Vegetables can be eaten raw or lightly drizzled with oil and cooked  - Vegetables common to the traditional Mediterranean Diet include: artichokes, arugula, beets, broccoli, brussel sprouts, cabbage, carrots, celery, collard greens, cucumbers, eggplant, kale, leeks, lemons, lettuce, mushrooms,  okra, onions, peas, peppers, potatoes, pumpkin, radishes, rutabaga, shallots, spinach, sweet potatoes, turnips, zucchini - Fruits common to the Mediterranean Diet include: apples, apricots, avocados, cherries, clementines, dates, figs, grapefruits, grapes, melons, nectarines, oranges, peaches, pears, pomegranates, strawberries, tangerines Fats - Replace butter and margarine with healthy oils, such as olive oil, canola oil, and tahini  - Limit nuts to no more than a handful a day  - Nuts include walnuts, almonds, pecans, pistachios, pine nuts  - Limit or avoid candied, honey roasted or heavily salted nuts - Olives are central to the [employer] - can be eaten whole or used in a variety of dishes  Meats Protein - Limiting red meat: no more than a few times a month - When eating red meat: choose lean cuts and keep the portion to the size of deck of cards - Eggs: approx. 0 to 4 times a week  - Fish and lean poultry: at least 2 a week  - Healthy protein sources include, chicken, [place], lean beef, lamb - Increase intake of seafood such as tuna, salmon, trout, mackerel, shrimp, scallops - Avoid or limit high fat processed meats such as sausage and bacon Dairy - Include moderate amounts of low fat dairy products  - Focus on healthy dairy such as fat free yogurt, skim milk, low or reduced fat cheese - Limit dairy products higher in fat such as whole or 2% milk, cheese, ice cream  Alcohol - Moderate amounts of red wine is ok  - No more than 5 oz daily for women (all ages) and men older than age 11  - No more than 10 oz of wine daily for men younger than 39  Other - Limit sweets and other desserts  - Use herbs and spices instead of salt to flavor foods  - Herbs and spices common to the traditional Mediterranean Diet include: basil, bay leaves, chives, cloves, cumin, fennel, garlic, lavender, marjoram, mint, oregano, parsley, pepper, rosemary, sage, savory, sumac, tarragon, thyme  It's not just a  diet, it's a lifestyle:  The Mediterranean diet includes lifestyle factors typical of those in the region  Foods, drinks and meals are best eaten with others and savored Daily physical activity is important for overall good health This could be strenuous exercise like running and aerobics This could also be more leisurely activities such as walking, housework, yard-work, or taking the stairs Moderation is the key; a balanced and healthy diet accommodates most foods and drinks Consider portion sizes and frequency of consumption of certain foods   Meal Ideas & Options:  Breakfast:  Whole wheat toast or whole wheat English muffins with peanut butter & hard boiled egg Steel cut oats topped with apples & cinnamon and skim milk  Fresh fruit: banana, strawberries, melon, berries, peaches  Smoothies: strawberries, bananas, greek yogurt, peanut butter Low fat greek yogurt with blueberries and granola  Egg white omelet with spinach and mushrooms Breakfast couscous: whole wheat couscous, apricots, skim milk, cranberries  Sandwiches:  Hummus and grilled vegetables (peppers, zucchini, squash) on whole wheat bread  Grilled chicken on whole wheat pita with lettuce, tomatoes, cucumbers or tzatziki  [place]una salad on whole wheat bread: tuna salad made with greek yogurt, olives, red peppers, capers, green onions Garlic rosemary lamb pita: lamb sauted with garlic, rosemary, salt & pepper; add lettuce, cucumber, greek yogurt to pita - flavor with lemon juice and black pepper  Seafood:  Mediterranean grilled salmon, seasoned with garlic, basil, parsley, lemon juice and black pepper Shrimp, lemon, and spinach whole-grain pasta salad made with low fat greek yogurt  Seared scallops with lemon orzo  Seared tuna steaks seasoned salt, pepper, coriander topped with tomato mixture of olives, tomatoes, olive oil, minced garlic, parsley, green onions and cappers  Meats:  Herbed greek chicken salad with kalamata olives,  cucumber, feta  Red bell peppers stuffed with spinach, bulgur, lean ground beef (or lentils) & topped with feta  Kebabs: skewers of chicken, tomatoes, onions, zucchini, squash  [place] burgers: made with red onions, mint, dill, lemon juice, feta cheese topped with roasted red peppers Vegetarian Cucumber salad: cucumbers, artichoke hearts, celery, red onion, feta cheese, tossed in olive oil & lemon juice  Hummus and whole grain pita points with a greek salad (lettuce, tomato, feta, olives, cucumbers, red onion) Lentil soup with celery, carrots made with vegetable broth, garlic, salt and pepper  Tabouli salad: parsley, bulgur, mint, scallions, cucumbers, tomato, radishes, lemon juice, olive oil, salt and pepper.   Copy to pt.  ______

## 2014-12-31 DIAGNOSIS — I509 Heart failure, unspecified: Secondary | ICD-10-CM

## 2014-12-31 HISTORY — DX: Heart failure, unspecified: I50.9

## 2015-02-24 ENCOUNTER — Ambulatory Visit: Payer: No Typology Code available for payment source | Admitting: Family Medicine

## 2015-03-23 ENCOUNTER — Ambulatory Visit: Payer: No Typology Code available for payment source | Admitting: Family Medicine

## 2015-03-25 ENCOUNTER — Ambulatory Visit: Payer: No Typology Code available for payment source | Admitting: Family Medicine

## 2015-04-04 ENCOUNTER — Ambulatory Visit (INDEPENDENT_AMBULATORY_CARE_PROVIDER_SITE_OTHER): Payer: No Typology Code available for payment source | Admitting: Family Medicine

## 2015-04-04 VITALS — BP 138/86 | HR 91 | Temp 98.1°F | Resp 18 | Ht 63.0 in | Wt 255.0 lb

## 2015-04-04 DIAGNOSIS — M5432 Sciatica, left side: Secondary | ICD-10-CM | POA: Diagnosis not present

## 2015-04-04 MED ORDER — HYDROCODONE-ACETAMINOPHEN 5-325 MG PO TABS: 1.0000 | ORAL_TABLET | Freq: Four times a day (QID) | ORAL | Status: DC | PRN

## 2015-04-04 MED ORDER — PREDNISONE 20 MG PO TABS: 40.0000 mg | ORAL_TABLET | Freq: Every day | ORAL | Status: DC

## 2015-04-04 NOTE — Patient Instructions (Signed)
Several important points to make and keep in mind when you're to lose weight 1 avoid artificial sweeteners 2 avoids foods that are high in carbohydrate, particularly high fructose or cane syrup 3 artificial preservatives  Instead it's important to rely on fresh vegetables and fresh foods including meats for your nutritioin   Sciatica Sciatica is pain, weakness, numbness, or tingling along the path of the sciatic nerve. The nerve starts in the lower back and runs down the back of each leg. The nerve controls the muscles in the lower leg and in the back of the knee, while also providing sensation to the back of the thigh, lower leg, and the sole of your foot. Sciatica is a symptom of another medical condition. For instance, nerve damage or certain conditions, such as a herniated disk or bone spur on the spine, pinch or put pressure on the sciatic nerve. This causes the pain, weakness, or other sensations normally associated with sciatica. Generally, sciatica only affects one side of the body. CAUSES   Herniated or slipped disc.  Degenerative disk disease.  A pain disorder involving the narrow muscle in the buttocks (piriformis syndrome).  Pelvic injury or fracture.  Pregnancy.  Tumor (rare). SYMPTOMS  Symptoms can vary from mild to very severe. The symptoms usually travel from the low back to the buttocks and down the back of the leg. Symptoms can include:  Mild tingling or dull aches in the lower back, leg, or hip.  Numbness in the back of the calf or sole of the foot.  Burning sensations in the lower back, leg, or hip.  Sharp pains in the lower back, leg, or hip.  Leg weakness.  Severe back pain inhibiting movement. These symptoms may get worse with coughing, sneezing, laughing, or prolonged sitting or standing. Also, being overweight may worsen symptoms. DIAGNOSIS  Your caregiver will perform a physical exam to look for common symptoms of sciatica. He or she may ask you to do  certain movements or activities that would trigger sciatic nerve pain. Other tests may be performed to find the cause of the sciatica. These may include:  Blood tests.  X-rays.  Imaging tests, such as an MRI or CT scan. TREATMENT  Treatment is directed at the cause of the sciatic pain. Sometimes, treatment is not necessary and the pain and discomfort goes away on its own. If treatment is needed, your caregiver may suggest:  Over-the-counter medicines to relieve pain.  Prescription medicines, such as anti-inflammatory medicine, muscle relaxants, or narcotics.  Applying heat or ice to the painful area.  Steroid injections to lessen pain, irritation, and inflammation around the nerve.  Reducing activity during periods of pain.  Exercising and stretching to strengthen your abdomen and improve flexibility of your spine. Your caregiver may suggest losing weight if the extra weight makes the back pain worse.  Physical therapy.  Surgery to eliminate what is pressing or pinching the nerve, such as a bone spur or part of a herniated disk. HOME CARE INSTRUCTIONS   Only take over-the-counter or prescription medicines for pain or discomfort as directed by your caregiver.  Apply ice to the affected area for 20 minutes, 3-4 times a day for the first 48-72 hours. Then try heat in the same way.  Exercise, stretch, or perform your usual activities if these do not aggravate your pain.  Attend physical therapy sessions as directed by your caregiver.  Keep all follow-up appointments as directed by your caregiver.  Do not wear high heels or  shoes that do not provide proper support.  Check your mattress to see if it is too soft. A firm mattress may lessen your pain and discomfort. SEEK IMMEDIATE MEDICAL CARE IF:   You lose control of your bowel or bladder (incontinence).  You have increasing weakness in the lower back, pelvis, buttocks, or legs.  You have redness or swelling of your  back.  You have a burning sensation when you urinate.  You have pain that gets worse when you lie down or awakens you at night.  Your pain is worse than you have experienced in the past.  Your pain is lasting longer than 4 weeks.  You are suddenly losing weight without reason. MAKE SURE YOU:  Understand these instructions.  Will watch your condition.  Will get help right away if you are not doing well or get worse. Document Released: 12/11/2001 Document Revised: 06/17/2012 Document Reviewed: 04/27/2012 [facility] Patient Information 2015 [place], [place]. This information is not intended to replace advice given to you by your health care provider. Make sure you discuss any questions you have with your health care provider.

## 2015-04-04 NOTE — Progress Notes (Signed)
° °  Subjective:    Patient ID: Robin May, female    DOB: 1986-02-08, 29 y.o.   MRN: [id number] This chart was scribed for Elvina SidleKurt Lauenstein, MD by Littie Deedsichard Sun, Medical Scribe. This patient was seen in Room 7 and the patient's care was started at 10:33 AM.   HPI HPI Comments: Robin May is a 29 y.o. female with a hx of obesity who presents to the [facility] complaining of gradual onset back pain radiating down her left leg that started 4 days ago. Patient has concern for pinched nerve or other nerve issue. She initially thought she was having muscle spasms; she took muscle spasm medication and pain medication but without relief. The pain is worsened with bending and sitting. Patient denies fever and urinary symptoms. She also denies falling. She has been working on losing weight and does elliptical, treadmill and ab exercises.  Patient works at a daycare center.   Review of Systems  Musculoskeletal: Positive for back pain.       Objective:   Physical Exam CONSTITUTIONAL: Well developed/well nourished HEAD: Normocephalic/atraumatic EYES: EOM/PERRL ENMT: Mucous membranes moist NECK: supple no meningeal signs SPINE: Positive straight leg raise bilaterally, worse on the left. CV: S1/S2 noted, no murmurs/rubs/gallops noted LUNGS: Lungs are clear to auscultation bilaterally, no apparent distress ABDOMEN: soft, nontender, no rebound or guarding GU: no cva tenderness NEURO: Pt is awake/alert, moves all extremitiesx4. Decreased reflexes. EXTREMITIES: pulses normal, full ROM SKIN: warm, color normal PSYCH: no abnormalities of mood noted        Assessment & Plan:   This chart was scribed in my presence and reviewed by me personally.    ICD-9-CM ICD-10-CM   1. Sciatica associated with disorder of lumbar spine, left 724.3 M54.32 predniSONE (DELTASONE) 20 MG tablet     HYDROcodone-acetaminophen (NORCO) 5-325 MG per tablet     Signed, Elvina SidleKurt Lauenstein,  MD

## 2015-04-06 ENCOUNTER — Telehealth: Payer: Self-pay

## 2015-04-06 DIAGNOSIS — M5432 Sciatica, left side: Secondary | ICD-10-CM

## 2015-04-06 NOTE — Telephone Encounter (Signed)
Patient is calling to request refills for hydrocodone and prednizone [facility]. Patient needs enough to last until her appointment on the 12th. Please call when ready for pickup. [phone]

## 2015-04-07 MED ORDER — HYDROCODONE-ACETAMINOPHEN 5-325 MG PO TABS
1.0000 | ORAL_TABLET | Freq: Four times a day (QID) | ORAL | Status: DC | PRN
Start: 2015-04-07 — End: 2015-09-12

## 2015-04-07 NOTE — Telephone Encounter (Signed)
I am not going to refill the prednisone. That is a steroid burst and she has finished the course. She can further discuss at her 4/12 appt. I have refilled #12 norco again with no refills. This will need to last her until 4/12. She will need to be seen for any further norco refills.

## 2015-04-07 NOTE — Telephone Encounter (Signed)
Patient is calling to follow up on medication refill because the pharmacy she uses is closed on Friday. Please call when medications are ready! [phone]

## 2015-04-07 NOTE — Telephone Encounter (Signed)
Pt.notified

## 2015-04-12 ENCOUNTER — Ambulatory Visit (INDEPENDENT_AMBULATORY_CARE_PROVIDER_SITE_OTHER): Payer: No Typology Code available for payment source | Admitting: Family Medicine

## 2015-04-12 ENCOUNTER — Encounter: Payer: Self-pay | Admitting: Family Medicine

## 2015-04-12 VITALS — BP 106/84 | HR 68 | Temp 98.7°F | Resp 16 | Ht 62.75 in | Wt 254.4 lb

## 2015-04-12 DIAGNOSIS — M5432 Sciatica, left side: Secondary | ICD-10-CM | POA: Diagnosis not present

## 2015-04-12 DIAGNOSIS — I110 Hypertensive heart disease with heart failure: Secondary | ICD-10-CM | POA: Diagnosis not present

## 2015-04-12 DIAGNOSIS — I509 Heart failure, unspecified: Secondary | ICD-10-CM

## 2015-04-12 NOTE — Patient Instructions (Addendum)

## 2015-04-13 ENCOUNTER — Encounter: Payer: Self-pay | Admitting: Family Medicine

## 2015-04-13 NOTE — Progress Notes (Signed)
S:  This 29 y.o. Female is here for HTN follow-up; she has Hypertensive CHF, well controlled on current medications w/o adverse effects. She is addressing weight issue and lost 5 lbs since Sept 2015. Exercise has been halted due to L-sided sciatica, treated last week as 102 UMFC. Pain has decreased but she still has occasional twinge in posterior L hip radiating down back of leg. Medications prescribed were effective. She has been told that her mattress may be too soft.   Patient Active Problem List   Diagnosis Date Noted  . Low serum high density lipoprotein (HDL) 09/22/2014  . Allergic rhinitis 09/22/2014  . Acute diastolic congestive heart failure, NYHA class 3 08/20/2012  . Hypertensive CHF (congestive heart failure) 08/19/2012  . Obesity, morbid 08/19/2012    Prior to Admission medications   Medication Sig Start Date End Date Taking? Authorizing Provider  amLODipine (NORVASC) 10 MG tablet Take 1 tablet (10 mg total) by mouth daily. 09/22/14 09/22/15 Yes Maurice MarchBarbara B Layani Foronda, MD  carvedilol (COREG) 25 MG tablet Take 1 1/2 tablets by mouth twice a day. 09/22/14  Yes Maurice MarchBarbara B Titiana Severa, MD  cyclobenzaprine (FLEXERIL) 10 MG tablet Take 1 tablet (10 mg total) by mouth 3 (three) times daily as needed for muscle spasms. 09/22/14  Yes Maurice MarchBarbara B Vanetta Rule, MD  HYDROcodone-acetaminophen (NORCO) 5-325 MG per tablet Take 1 tablet by mouth every 6 (six) hours as needed for moderate pain. 04/07/15  Yes Todd M McVeigh, PA  losartan-hydrochlorothiazide (HYZAAR) 50-12.5 MG per tablet Take 1 tablet by mouth daily. 09/22/14  Yes Maurice MarchBarbara B Harmonee Tozer, MD  montelukast (SINGULAIR) 10 MG tablet Take 1 tablet (10 mg total) by mouth at bedtime. 09/22/14  Yes Maurice MarchBarbara B Donn Wilmot, MD  spironolactone (ALDACTONE) 50 MG tablet Take 1 tablet (50 mg total) by mouth daily. 09/22/14  Yes Maurice MarchBarbara B Anayely Constantine, MD  Cyanocobalamin (VITAMIN B 12 PO) Take 1 tablet by mouth daily.    Historical Provider, MD  naproxen sodium (ANAPROX DS) 550  MG tablet Take 1 tablet (550 mg total) by mouth 2 (two) times daily with a meal. Patient not taking: Reported on 04/12/2015 09/22/14 09/22/15  Maurice MarchBarbara B Aviannah Castoro, MD    SURG, SOC and FAM HX reviewed.  ROS: Negative for fatigue, diaphoresis, vision disturbances, CP or palpitations, SOB or DOE< cough, edema, HA, dizziness, weakness or syncope. LBP as per HPI. No numbness or loss of use of L leg.  O: Filed Vitals:   04/12/15 1506  BP: 106/84  Pulse: 68  Temp: 98.7 F (37.1 C)  Resp: 16    GEN: In NAD; WN,WD. HENT: [place]/AT; EOMI w/ clear conj/sclerae. Otherwise unremarkable. NECK: Supple. COR: RRR. LUNGS: CTA. BACK: Straight spine; tender L SI joint. Forward flexion limited. Pt has difficulty bearing weight on L leg. NEURO: A&O x 3; CNs intact. Nonfocal.  A/P: Hypertensive CHF (congestive heart failure)- Stable on current medications. COntinue w/ weight loss efforts.  Sciatica, left- Recommended stretching maneuvers before getting out of bed. Stay active during the day and avoid sitting foe extended periods. If pain persists, will need xrays and formal referral to PT.

## 2015-06-16 ENCOUNTER — Telehealth: Payer: Self-pay | Admitting: Family Medicine

## 2015-06-16 NOTE — Telephone Encounter (Signed)
lmom to call back and reschedule her appt that she had on 07/14/15 the provider will not be in the office that day

## 2015-07-14 ENCOUNTER — Ambulatory Visit: Payer: No Typology Code available for payment source | Admitting: Physician Assistant

## 2015-08-08 ENCOUNTER — Ambulatory Visit: Payer: Self-pay | Admitting: Family Medicine

## 2015-08-08 ENCOUNTER — Telehealth: Payer: Self-pay

## 2015-08-08 NOTE — Telephone Encounter (Signed)
LMVM patient missed appointment with Deboraha Sprang today.  Please CB to reschedule.

## 2015-09-12 ENCOUNTER — Encounter: Payer: Self-pay | Admitting: Family Medicine

## 2015-09-12 ENCOUNTER — Ambulatory Visit (INDEPENDENT_AMBULATORY_CARE_PROVIDER_SITE_OTHER): Payer: No Typology Code available for payment source | Admitting: Family Medicine

## 2015-09-12 VITALS — BP 130/82 | HR 81 | Temp 98.6°F | Resp 16 | Wt 261.0 lb

## 2015-09-12 DIAGNOSIS — J309 Allergic rhinitis, unspecified: Secondary | ICD-10-CM

## 2015-09-12 DIAGNOSIS — I509 Heart failure, unspecified: Secondary | ICD-10-CM

## 2015-09-12 DIAGNOSIS — I110 Hypertensive heart disease with heart failure: Secondary | ICD-10-CM

## 2015-09-12 DIAGNOSIS — J3089 Other allergic rhinitis: Secondary | ICD-10-CM

## 2015-09-12 LAB — BASIC METABOLIC PANEL
BUN: 11 mg/dL (ref 7–25)
CO2: 25 mmol/L (ref 20–31)
Calcium: 9.6 mg/dL (ref 8.6–10.2)
Chloride: 102 mmol/L (ref 98–110)
Creat: 0.92 mg/dL (ref 0.50–1.10)
Glucose, Bld: 98 mg/dL (ref 65–99)
POTASSIUM: 4.2 mmol/L (ref 3.5–5.3)
Sodium: 136 mmol/L (ref 135–146)

## 2015-09-12 NOTE — Progress Notes (Signed)
Subjective:    Patient ID: Robin May, female    DOB: Jan 13, 1986, 29 y.o.   MRN: [id number]  HPI This is a pleasant 29 yo female who has a history of HTN, CHF who is currently on amlodipine, spironolactone, coreg, singulair. She has been released from care of cardiology. She reports compliance with her medications and denies side effects or missing doses.   She works with infants and toddlers. Walks 3x week. Has noticed recent weight gain and admits to not watching diet very carefully. She is interested in a structured eating program.   Past Medical History  Diagnosis Date  . Hypertension   . Anginal pain 08/18/2012    "mild"  . Shortness of breath 08/19/2012    "w/exertion and w/lying down"  . Allergy    Past Surgical History  Procedure Laterality Date  . No past surgeries     Family History  Problem Relation Age of Onset  . Hypertension Mother   . Hypertension Maternal Grandmother   . Hypertension Maternal Grandfather   . Hypertension Paternal Grandmother   . Hypertension Paternal Grandfather    Social History  Substance Use Topics  . Smoking status: Former Smoker    Types: Cigars    Quit date: 12/31/2005  . Smokeless tobacco: Never Used     Comment: 08/19/2012 "smoked black and milds"  . Alcohol Use: 0.6 - 1.2 oz/week    1-2 Glasses of wine per week   Medications, allergies, past medical history, surgical history, family history, social history and problem list reviewed and updated.  Review of Systems No chest pain, no SOB, no nausea/vomiting/diarrhea/constipation    Objective:   Physical Exam  Physical Exam  Constitutional: Oriented to person, place, and time. She appears well-developed and well-nourished. Obese HENT:  Head: Normocephalic and atraumatic.  Eyes: Conjunctivae are normal.  Neck: Normal range of motion. Neck supple.  Cardiovascular: Normal rate, regular rhythm and normal heart sounds.   Pulmonary/Chest: Effort normal and breath sounds  normal.  Musculoskeletal: Normal range of motion.  Neurological: Alert and oriented to person, place, and time.  Skin: Skin is warm and dry.  Psychiatric: Normal mood and affect. Behavior is normal. Judgment and thought content normal.  Vitals reviewed. BP 130/82 mmHg  Pulse 81  Temp(Src) 98.6 F (37 C) (Oral)  Resp 16  Wt 261 lb (118.389 kg)  LMP 08/15/2015 Wt Readings from Last 3 Encounters:  09/12/15 261 lb (118.389 kg)  04/12/15 254 lb 6.4 oz (115.395 kg)  04/04/15 255 lb (115.667 kg)      Assessment & Plan:  1. Perennial allergic rhinitis - montelukast (SINGULAIR) 10 MG tablet; Take 1 tablet (10 mg total) by mouth at bedtime.  Dispense: 30 tablet; Refill: 11  2. Hypertensive CHF (congestive heart failure) - blood pressure normal today - Basic metabolic panel - amLODipine (NORVASC) 10 MG tablet; Take 1 tablet (10 mg total) by mouth daily.  Dispense: 90 tablet; Refill: 3 - carvedilol (COREG) 25 MG tablet; Take 1 1/2 tablets by mouth twice a day.  Dispense: 100 tablet; Refill: 11 - losartan-hydrochlorothiazide (HYZAAR) 50-12.5 MG per tablet; Take 1 tablet by mouth daily.  Dispense: 30 tablet; Refill: 11 - spironolactone (ALDACTONE) 50 MG tablet; Take 1 tablet (50 mg total) by mouth daily.  Dispense: 30 tablet; Refill: 11  3. Obesity, morbid - discussed her typical eating habits and discussed increasing protein, fiber and decreasing soda/juice - recommended [employer] as she thinks she would enjoy the camaraderie of attending  meetings and is looking for very specific food guidelines.   - follow up in 6 months for CPE and will obtain fasting labs at that time.   Olean Ree, FNP-BC  [facility]  09/15/2015 2:33 PM

## 2015-09-15 MED ORDER — MONTELUKAST SODIUM 10 MG PO TABS: 10.0000 mg | ORAL_TABLET | Freq: Every day | ORAL | Status: DC

## 2015-09-15 MED ORDER — SPIRONOLACTONE 50 MG PO TABS: 50.0000 mg | ORAL_TABLET | Freq: Every day | ORAL | Status: DC

## 2015-09-15 MED ORDER — LOSARTAN POTASSIUM-HCTZ 50-12.5 MG PO TABS: 1.0000 | ORAL_TABLET | Freq: Every day | ORAL | Status: DC

## 2015-09-15 MED ORDER — CARVEDILOL 25 MG PO TABS: ORAL_TABLET | ORAL | Status: DC

## 2015-09-15 MED ORDER — AMLODIPINE BESYLATE 10 MG PO TABS: 10.0000 mg | ORAL_TABLET | Freq: Every day | ORAL | Status: DC

## 2016-02-15 ENCOUNTER — Encounter (HOSPITAL_COMMUNITY): Payer: Self-pay | Admitting: Emergency Medicine

## 2016-02-15 ENCOUNTER — Emergency Department (HOSPITAL_COMMUNITY)
Admission: EM | Admit: 2016-02-15 | Discharge: 2016-02-16 | Disposition: A | Discharge status: Home / Self Care | Payer: 59 | Attending: Emergency Medicine | Admitting: Emergency Medicine

## 2016-02-15 DIAGNOSIS — Z87891 Personal history of nicotine dependence: Secondary | ICD-10-CM | POA: Insufficient documentation

## 2016-02-15 DIAGNOSIS — I209 Angina pectoris, unspecified: Secondary | ICD-10-CM | POA: Insufficient documentation

## 2016-02-15 DIAGNOSIS — H109 Unspecified conjunctivitis: Secondary | ICD-10-CM | POA: Insufficient documentation

## 2016-02-15 DIAGNOSIS — I10 Essential (primary) hypertension: Secondary | ICD-10-CM | POA: Insufficient documentation

## 2016-02-15 DIAGNOSIS — J029 Acute pharyngitis, unspecified: Secondary | ICD-10-CM | POA: Insufficient documentation

## 2016-02-15 DIAGNOSIS — Z79899 Other long term (current) drug therapy: Secondary | ICD-10-CM | POA: Insufficient documentation

## 2016-02-15 NOTE — ED Notes (Signed)
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

## 2016-02-16 MED ORDER — PENICILLIN G BENZATHINE 1200000 UNIT/2ML IM SUSP
1.2000 10*6.[IU] | Freq: Once | INTRAMUSCULAR | Status: AC
  Administered 2016-02-16: 1.2 10*6.[IU] via INTRAMUSCULAR
  Filled 2016-02-16: qty 2

## 2016-02-16 MED ORDER — POLYMYXIN B-TRIMETHOPRIM 10000-0.1 UNIT/ML-% OP SOLN
1.0000 [drp] | OPHTHALMIC | Status: DC
  Administered 2016-02-16: 1 [drp] via OPHTHALMIC
  Filled 2016-02-16: qty 10

## 2016-02-16 NOTE — Discharge Instructions (Signed)
Bacterial Conjunctivitis °Bacterial conjunctivitis, commonly called pink eye, is an inflammation of the clear membrane that covers the white part of the eye (conjunctiva). The inflammation can also happen on the underside of the eyelids. The blood vessels in the conjunctiva become inflamed, causing the eye to become red or pink. Bacterial conjunctivitis may spread easily from one eye to another and from person to person (contagious).  °CAUSES  °Bacterial conjunctivitis is caused by bacteria. The bacteria may come from your own skin, your upper respiratory tract, or from someone else with bacterial conjunctivitis. °SYMPTOMS  °The normally white color of the eye or the underside of the eyelid is usually pink or red. The pink eye is usually associated with irritation, tearing, and some sensitivity to light. Bacterial conjunctivitis is often associated with a thick, yellowish discharge from the eye. The discharge may turn into a crust on the eyelids overnight, which causes your eyelids to stick together. If a discharge is present, there may also be some blurred vision in the affected eye. °DIAGNOSIS  °Bacterial conjunctivitis is diagnosed by your caregiver through an eye exam and the symptoms that you report. Your caregiver looks for changes in the surface tissues of your eyes, which may point to the specific type of conjunctivitis. A sample of any discharge may be collected on a cotton-tip swab if you have a severe case of conjunctivitis, if your cornea is affected, or if you keep getting repeat infections that do not respond to treatment. The sample will be sent to a lab to see if the inflammation is caused by a bacterial infection and to see if the infection will respond to antibiotic medicines. °TREATMENT  °· Bacterial conjunctivitis is treated with antibiotics. Antibiotic eyedrops are most often used. However, antibiotic ointments are also available. Antibiotics pills are sometimes used. Artificial tears or eye  washes may ease discomfort. °HOME CARE INSTRUCTIONS  °· To ease discomfort, apply a cool, clean washcloth to your eye for 10-20 minutes, 3-4 times a day. °· Gently wipe away any drainage from your eye with a warm, wet washcloth or a cotton ball. °· Wash your hands often with soap and water. Use paper towels to dry your hands. °· Do not share towels or washcloths. This may spread the infection. °· Change or wash your pillowcase every day. °· You should not use eye makeup until the infection is gone. °· Do not operate machinery or drive if your vision is blurred. °· Stop using contact lenses. Ask your caregiver how to sterilize or replace your contacts before using them again. This depends on the type of contact lenses that you use. °· When applying medicine to the infected eye, do not touch the edge of your eyelid with the eyedrop bottle or ointment tube. °SEEK IMMEDIATE MEDICAL CARE IF:  °· Your infection has not improved within 3 days after beginning treatment. °· You had yellow discharge from your eye and it returns. °· You have increased eye pain. °· Your eye redness is spreading. °· Your vision becomes blurred. °· You have a fever or persistent symptoms for more than 2-3 days. °· You have a fever and your symptoms suddenly get worse. °· You have facial pain, redness, or swelling. °MAKE SURE YOU:  °· Understand these instructions. °· Will watch your condition. °· Will get help right away if you are not doing well or get worse. °  °This information is not intended to replace advice given to you by your health care provider. Make sure you   discuss any questions you have with your health care provider. °  °Document Released: 12/17/2005 Document Revised: 01/07/2015 Document Reviewed: 05/19/2012 °Elsevier Interactive Patient Education ©2016 [employer] °Pharyngitis °Pharyngitis is redness, pain, and swelling (inflammation) of your pharynx.  °CAUSES  °Pharyngitis is usually caused by infection. Most of the time, these  infections are from viruses (viral) and are part of a cold. However, sometimes pharyngitis is caused by bacteria (bacterial). Pharyngitis can also be caused by allergies. Viral pharyngitis may be spread from person to person by coughing, sneezing, and personal items or utensils (cups, forks, spoons, toothbrushes). Bacterial pharyngitis may be spread from person to person by more intimate contact, such as kissing.  °SIGNS AND SYMPTOMS  °Symptoms of pharyngitis include:   °· Sore throat.   °· Tiredness (fatigue).   °· Low-grade fever.   °· Headache. °· Joint pain and muscle aches. °· Skin rashes. °· Swollen lymph nodes. °· Plaque-like film on throat or tonsils (often seen with bacterial pharyngitis). °DIAGNOSIS  °Your health care provider will ask you questions about your illness and your symptoms. Your medical history, along with a physical exam, is often all that is needed to diagnose pharyngitis. Sometimes, a rapid strep test is done. Other lab tests may also be done, depending on the suspected cause.  °TREATMENT  °Viral pharyngitis will usually get better in 3-4 days without the use of medicine. Bacterial pharyngitis is treated with medicines that kill germs (antibiotics).  °HOME CARE INSTRUCTIONS  °· Drink enough water and fluids to keep your urine clear or pale yellow.   °· Only take over-the-counter or prescription medicines as directed by your health care provider:   °¨ If you are prescribed antibiotics, make sure you finish them even if you start to feel better.   °¨ Do not take aspirin.   °· Get lots of rest.   °· Gargle with 8 oz of salt water (½ tsp of salt per 1 qt of water) as often as every 1-2 hours to soothe your throat.   °· Throat lozenges (if you are not at risk for choking) or sprays may be used to soothe your throat. °SEEK MEDICAL CARE IF:  °· You have large, tender lumps in your neck. °· You have a rash. °· You cough up green, yellow-brown, or bloody spit. °SEEK IMMEDIATE MEDICAL CARE IF:   °· Your neck becomes stiff. °· You drool or are unable to swallow liquids. °· You vomit or are unable to keep medicines or liquids down. °· You have severe pain that does not go away with the use of recommended medicines. °· You have trouble breathing (not caused by a stuffy nose). °MAKE SURE YOU:  °· Understand these instructions. °· Will watch your condition. °· Will get help right away if you are not doing well or get worse. °  °This information is not intended to replace advice given to you by your health care provider. Make sure you discuss any questions you have with your health care provider. °  °Document Released: 12/17/2005 Document Revised: 10/07/2013 Document Reviewed: 08/24/2013 °Elsevier Interactive Patient Education ©2016 [employer]vier Inc. ° °

## 2016-02-16 NOTE — ED Provider Notes (Signed)
CSN: [id number]     Arrival date & time 02/15/16  2140 History  By signing my name below, I, Budd Palmer, attest that this documentation has been prepared under the direction and in the presence of Gilda Crease, MD. Electronically Signed: Budd Palmer, ED Scribe. 02/16/2016. 12:28 AM.    Chief Complaint  Patient presents with  . Conjunctivitis  . Sore Throat   The history is provided by the patient. No language interpreter was used.   HPI Comments: Robin May is a 30 y.o. female with a PMHx of HTN who presents to the Emergency Department complaining of bilateral conjunctivitis and sore throat onset today. 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 She reports associated pain with swallowing, mild coughing, as well as increased bilateral eye drainage, itching, and redness. She notes she has tried many different medications including cold medications and decongestants. She reports a PMHx of HTN for which she is on medication. Pt denies abdominal pain and n/v/d.   Past Medical History  Diagnosis Date  . Hypertension   . Anginal pain (HCC) 08/18/2012    "mild"  . Shortness of breath 08/19/2012    "w/exertion and w/lying down"  . Allergy    Past Surgical History  Procedure Laterality Date  . No past surgeries     Family History  Problem Relation Age of Onset  . Hypertension Mother   . Hypertension Maternal Grandmother   . Hypertension Maternal Grandfather   . Hypertension Paternal Grandmother   . Hypertension Paternal Grandfather   . Diabetes Father    Social History  Substance Use Topics  . Smoking status: Former Smoker    Types: Cigars    Quit date: 12/31/2005  . Smokeless tobacco: Never Used     Comment: 08/19/2012 "smoked black and milds"  . Alcohol Use: 0.6 - 1.2 oz/week    1-2 Glasses of wine per week     Comment: wine   OB History    No data available     Review of Systems  HENT: Positive for  sore throat.   Eyes: Positive for pain, discharge, redness and itching.  Respiratory: Positive for cough.   Gastrointestinal: Negative for nausea, vomiting, abdominal pain and diarrhea.  All other systems reviewed and are negative.   Allergies  Review of patient's allergies indicates no known allergies.  Home Medications   Prior to Admission medications   Medication Sig Start Date End Date Taking? Authorizing Provider  amLODipine (NORVASC) 10 MG tablet Take 1 tablet (10 mg total) by mouth daily. 09/15/15 09/14/16 Yes Emi Belfast, FNP  carvedilol (COREG) 25 MG tablet Take 1 1/2 tablets by mouth twice a day. Patient taking differently: Take 37.5 mg by mouth 2 (two) times daily with a meal.  09/15/15  Yes Emi Belfast, FNP  losartan-hydrochlorothiazide (HYZAAR) 50-12.5 MG per tablet Take 1 tablet by mouth daily. 09/15/15  Yes Emi Belfast, FNP  montelukast (SINGULAIR) 10 MG tablet Take 1 tablet (10 mg total) by mouth at bedtime. Patient not taking: Reported on 02/16/2016 09/15/15   Emi Belfast, FNP  spironolactone (ALDACTONE) 50 MG tablet Take 1 tablet (50 mg total) by mouth daily. Patient not taking: Reported on 02/16/2016 09/15/15   Emi Belfast, FNP   BP 199/121 mmHg  Pulse 108  Temp(Src) 98.7 F (37.1 C) (Oral)  Resp 20  SpO2 98%  LMP 01/12/2016 (Exact Date) Physical Exam  Constitutional: She is oriented to person, place, and time. She appears well-developed and well-nourished. No distress.  HENT:  Head: Normocephalic and atraumatic.  Right Ear: Hearing normal.  Left Ear: Hearing normal.  Nose: Nose normal.  Mouth/Throat: Mucous membranes are normal. No oropharyngeal exudate.  Oropharyngeal erythema without exudates  Eyes: EOM are normal. Pupils are equal, round, and reactive to light.  Bilateral conjunctival injection, some green drainage from the left eye.  Neck: Normal range of motion. Neck supple.  Cardiovascular: Regular rhythm, S1 normal and S2  normal.  Exam reveals no gallop and no friction rub.   No murmur heard. Pulmonary/Chest: Effort normal and breath sounds normal. No respiratory distress. She exhibits no tenderness.  Abdominal: Soft. Normal appearance and bowel sounds are normal. There is no hepatosplenomegaly. There is no tenderness. There is no rebound, no guarding, no tenderness at McBurney's point and negative Murphy's sign. No hernia.  Musculoskeletal: Normal range of motion.  Neurological: She is alert and oriented to person, place, and time. She has normal strength. No cranial nerve deficit or sensory deficit. Coordination normal. GCS eye subscore is 4. GCS verbal subscore is 5. GCS motor subscore is 6.  Skin: Skin is warm, dry and intact. No rash noted. No cyanosis.  Psychiatric: She has a normal mood and affect. Her speech is normal and behavior is normal. Thought content normal.  Nursing note and vitals reviewed.   ED Course  Procedures  DIAGNOSTIC STUDIES: Oxygen Saturation is 98% on RA, normal by my interpretation.    COORDINATION OF CARE: 12:24 AM - Discussed plans to order a shot of penicillin and eye drops. Will write a note for work. Pt advised of plan for treatment and pt agrees.  Labs Review Labs Reviewed - No data to display  Imaging Review No results found. I have personally reviewed and evaluated these images and lab results as part of my medical decision-making.   EKG Interpretation None      MDM   Final diagnoses:  Bilateral conjunctivitis  Pharyngitis    Patient with bilateral conjunctivitis. Will treat with Polytrim. She also has erythema and sore throat with positive strep contact. She was empirically treated with Bicillin.  I personally performed the services described in this documentation, which was scribed in my presence. The recorded information has been reviewed and is accurate.    Gilda Crease, MD 02/16/16 [phone]

## 2016-03-12 ENCOUNTER — Encounter: Payer: No Typology Code available for payment source | Admitting: Family Medicine

## 2016-10-11 ENCOUNTER — Telehealth: Payer: Self-pay

## 2016-10-11 DIAGNOSIS — I110 Hypertensive heart disease with heart failure: Secondary | ICD-10-CM

## 2016-10-11 MED ORDER — AMLODIPINE BESYLATE 10 MG PO TABS: 10.0000 mg | ORAL_TABLET | Freq: Every day | ORAL | 0 refills | Status: DC

## 2016-10-11 MED ORDER — SPIRONOLACTONE 50 MG PO TABS: 50.0000 mg | ORAL_TABLET | Freq: Every day | ORAL | 0 refills | Status: DC

## 2016-10-11 NOTE — Telephone Encounter (Signed)
Sent RFs to [facility] as req'd. LMOM for pt to advise of this and need for f/up w/in the month for more.

## 2016-10-11 NOTE — Telephone Encounter (Signed)
Pt is needing a refill on amlodipine and spironolactone  The pharmacy number is [phone] [place]  Best number for patient is [phone]

## 2016-10-23 ENCOUNTER — Other Ambulatory Visit: Payer: Self-pay

## 2016-10-23 DIAGNOSIS — I110 Hypertensive heart disease with heart failure: Secondary | ICD-10-CM

## 2016-10-23 MED ORDER — LOSARTAN POTASSIUM-HCTZ 50-12.5 MG PO TABS: 1.0000 | ORAL_TABLET | Freq: Every day | ORAL | 0 refills | Status: DC

## 2016-10-23 MED ORDER — CARVEDILOL 25 MG PO TABS: ORAL_TABLET | ORAL | 0 refills | Status: DC

## 2017-01-26 ENCOUNTER — Ambulatory Visit (INDEPENDENT_AMBULATORY_CARE_PROVIDER_SITE_OTHER): Payer: Self-pay | Admitting: Physician Assistant

## 2017-01-26 VITALS — BP 158/102 | HR 109 | Temp 98.8°F | Resp 18 | Ht 63.0 in | Wt 263.8 lb

## 2017-01-26 DIAGNOSIS — A599 Trichomoniasis, unspecified: Secondary | ICD-10-CM

## 2017-01-26 DIAGNOSIS — I110 Hypertensive heart disease with heart failure: Secondary | ICD-10-CM

## 2017-01-26 DIAGNOSIS — R350 Frequency of micturition: Secondary | ICD-10-CM

## 2017-01-26 LAB — POC MICROSCOPIC URINALYSIS (UMFC): MUCUS RE: ABSENT

## 2017-01-26 LAB — POCT URINALYSIS DIP (MANUAL ENTRY)
BILIRUBIN UA: NEGATIVE
BILIRUBIN UA: NEGATIVE
GLUCOSE UA: NEGATIVE
Nitrite, UA: NEGATIVE
SPEC GRAV UA: 1.01
Urobilinogen, UA: 0.2
pH, UA: 6.5

## 2017-01-26 MED ORDER — CARVEDILOL 25 MG PO TABS: ORAL_TABLET | ORAL | 3 refills | Status: DC

## 2017-01-26 MED ORDER — SPIRONOLACTONE 50 MG PO TABS: 50.0000 mg | ORAL_TABLET | Freq: Every day | ORAL | 3 refills | Status: DC

## 2017-01-26 MED ORDER — LOSARTAN POTASSIUM-HCTZ 50-12.5 MG PO TABS: 1.0000 | ORAL_TABLET | Freq: Every day | ORAL | 3 refills | Status: DC

## 2017-01-26 MED ORDER — METRONIDAZOLE 500 MG PO TABS: 500.0000 mg | ORAL_TABLET | Freq: Two times a day (BID) | ORAL | 0 refills | Status: DC

## 2017-01-26 MED ORDER — AMLODIPINE BESYLATE 10 MG PO TABS: 10.0000 mg | ORAL_TABLET | Freq: Every day | ORAL | 3 refills | Status: DC

## 2017-01-26 NOTE — Patient Instructions (Addendum)
   IF you received an x-ray today, you will receive an invoice from [facility]. Please contact [facility] at [phone] with questions or concerns regarding your invoice.   IF you received labwork today, you will receive an invoice from [facility]. Please contact [facility] at [phone] with questions or concerns regarding your invoice.   Our billing staff will not be able to assist you with questions regarding bills from these companies.  You will be contacted with the lab results as soon as they are available. The fastest way to get your results is to activate your My Chart account. Instructions are located on the last page of this paperwork. If you have not heard from us regarding the results in 2 weeks, please contact this office.     Trichomoniasis Trichomoniasis is an infection caused by an organism called Trichomonas. The infection can affect both women and men. In women, the outer female genitalia and the vagina are affected. In men, the penis is mainly affected, but the prostate and other reproductive organs can also be involved. Trichomoniasis is a sexually transmitted infection (STI) and is most often passed to another person through sexual contact.  RISK FACTORS  Having unprotected sexual intercourse.  Having sexual intercourse with an infected partner. SIGNS AND SYMPTOMS  Symptoms of trichomoniasis in women include:  Abnormal gray-green frothy vaginal discharge.  Itching and irritation of the vagina.  Itching and irritation of the area outside the vagina. Symptoms of trichomoniasis in men include:   Penile discharge with or without pain.  Pain during urination. This results from inflammation of the urethra. DIAGNOSIS  Trichomoniasis may be found during a Pap test or physical exam. Your health care provider may use one of the following methods to help diagnose this infection:  Testing the pH of the vagina with a test tape.  Using a vaginal swab  test that checks for the Trichomonas organism. A test is available that provides results within a few minutes.  Examining a urine sample.  Testing vaginal secretions. Your health care provider may test you for other STIs, including HIV. TREATMENT   You may be given medicine to fight the infection. Women should inform their health care provider if they could be or are pregnant. Some medicines used to treat the infection should not be taken during pregnancy.  Your health care provider may recommend over-the-counter medicines or creams to decrease itching or irritation.  Your sexual partner will need to be treated if infected.  Your health care provider may test you for infection again 3 months after treatment. HOME CARE INSTRUCTIONS   Take medicines only as directed by your health care provider.  Take over-the-counter medicine for itching or irritation as directed by your health care provider.  Do not have sexual intercourse while you have the infection.  Women should not douche or wear tampons while they have the infection.  Discuss your infection with your partner. Your partner may have gotten the infection from you, or you may have gotten it from your partner.  Have your sex partner get examined and treated if necessary.  Practice safe, informed, and protected sex.  See your health care provider for other STI testing. SEEK MEDICAL CARE IF:   You still have symptoms after you finish your medicine.  You develop abdominal pain.  You have pain when you urinate.  You have bleeding after sexual intercourse.  You develop a rash.  Your medicine makes you sick or makes you throw up (vomit). MAKE   SURE YOU:  Understand these instructions.  Will watch your condition.  Will get help right away if you are not doing well or get worse. This information is not intended to replace advice given to you by your health care provider. Make sure you discuss any questions you have with  your health care provider. Document Released: 06/12/2001 Document Revised: 01/07/2015 Document Reviewed: 09/28/2013 Elsevier Interactive Patient Education  2017 Elsevier Inc.  

## 2017-01-26 NOTE — Progress Notes (Signed)
Patient ID: Robin May, female    DOB: [birth year], 31 y.o.   MRN: [id number]  PCP: No PCP Per Patient  Chief Complaint  Patient presents with  . Vaginal Discharge    X 4 days  . Urinary Frequency    X 4 days  . Medication Refill    Coreg, Spironolactone, Amlodipine, Losartan-HCTZ    Subjective:   Presents for evaluation of 4 days of urinary frequency and vaginal itching, and also needs refills of all her regular medications.  She has hypertensive CHF, and is morbidly obese. She has been followed by cardiology, but due to not having insurance, was advised that she could get refills from primary care and see him only if she became symptomatic again. Unfortunately, she hasn't been here since 09/2015 because she cannot afford the visits.  Has been taking 1/2 doses of her meds due to running out in December. A treadmill arrives today, so she can start exercising more regularly, hopefully that will improve weight and BP.  No dysuria. No urgency.  Sexually active with one female partner. Condoms, inconsistently. Doesn't know if he has other partners. Small amount of vaginal discharge. Using soap and feminine hygiene product in the vulvar area, which seems to be helping. Drinking more water and drinking cranberry juice.   Review of Systems  Constitutional: Negative for activity change, appetite change, fatigue and unexpected weight change.  HENT: Negative for congestion, dental problem, ear pain, hearing loss, mouth sores, postnasal drip, rhinorrhea, sneezing, sore throat, tinnitus and trouble swallowing.   Eyes: Negative for photophobia, pain, redness and visual disturbance.  Respiratory: Negative for cough, chest tightness and shortness of breath.   Cardiovascular: Negative for chest pain, palpitations and leg swelling.  Gastrointestinal: Negative for abdominal pain, blood in stool, constipation, diarrhea, nausea and vomiting.  Genitourinary: Positive for dysuria and  vaginal discharge (and itching). Negative for frequency, hematuria and urgency.  Musculoskeletal: Negative for arthralgias, gait problem, myalgias and neck stiffness.  Skin: Negative for rash.  Neurological: Negative for dizziness, speech difficulty, weakness, light-headedness, numbness and headaches.  Hematological: Negative for adenopathy.  Psychiatric/Behavioral: Negative for confusion and sleep disturbance. The patient is not nervous/anxious.        Patient Active Problem List   Diagnosis Date Noted  . Low serum high density lipoprotein (HDL) 09/22/2014  . Allergic rhinitis 09/22/2014  . Acute diastolic congestive heart failure, NYHA class 3 (HCC) 08/20/2012  . Hypertensive CHF (congestive heart failure) (HCC) 08/19/2012  . Obesity, morbid (HCC) 08/19/2012     Prior to Admission medications   Medication Sig Start Date End Date Taking? Authorizing Provider  amLODipine (NORVASC) 10 MG tablet Take 1 tablet (10 mg total) by mouth daily. 10/11/16 10/11/17 Yes Kadence Mikkelson, PA-C  carvedilol (COREG) 25 MG tablet Take 1 1/2 tablets by mouth twice a day. 10/23/16  Yes Daziah Hesler, PA-C  losartan-hydrochlorothiazide (HYZAAR) 50-12.5 MG tablet Take 1 tablet by mouth daily. 10/23/16  Yes Serinity Ware, PA-C  montelukast (SINGULAIR) 10 MG tablet Take 1 tablet (10 mg total) by mouth at bedtime. 09/15/15  Yes Emi Belfast, FNP  spironolactone (ALDACTONE) 50 MG tablet Take 1 tablet (50 mg total) by mouth daily. 10/11/16  Yes Ottavio Norem, PA-C     No Known Allergies     Objective:  Physical Exam  Constitutional: She is oriented to person, place, and time. She appears well-developed and well-nourished. She is active and cooperative. No distress.  BP (!) 158/102   Pulse Marland Kitchen)  109   Temp 98.8 F (37.1 C) (Oral)   Resp 18   Ht 5\' 3"  (1.6 m)   Wt 263 lb 12.8 oz (119.7 kg)   LMP 01/12/2017   SpO2 96%   BMI 46.73 kg/m   HENT:  Head: Normocephalic and atraumatic.  Right Ear:  Hearing normal.  Left Ear: Hearing normal.  Eyes: Conjunctivae are normal. No scleral icterus.  Neck: Normal range of motion. Neck supple. No thyromegaly present.  Cardiovascular: Normal rate, regular rhythm and normal heart sounds.   Pulses:      Radial pulses are 2+ on the right side, and 2+ on the left side.  Pulmonary/Chest: Effort normal and breath sounds normal.  Lymphadenopathy:       Head (right side): No tonsillar, no preauricular, no posterior auricular and no occipital adenopathy present.       Head (left side): No tonsillar, no preauricular, no posterior auricular and no occipital adenopathy present.    She has no cervical adenopathy.       Right: No supraclavicular adenopathy present.       Left: No supraclavicular adenopathy present.  Neurological: She is alert and oriented to person, place, and time. No sensory deficit.  Skin: Skin is warm, dry and intact. No rash noted. No cyanosis or erythema. Nails show no clubbing.  Psychiatric: She has a normal mood and affect. Her speech is normal and behavior is normal.       Results for orders placed or performed in visit on 01/26/17  POCT Microscopic Urinalysis ([facility])  Result Value Ref Range   WBC,UR,HPF,POC Few (A) None WBC/hpf   RBC,UR,HPF,POC None None RBC/hpf   Bacteria Few (A) None, Too numerous to count   Mucus Absent Absent   Epithelial Cells, UR Per Microscopy Few (A) None, Too numerous to count cells/hpf   Trichomonas Present   POCT urinalysis dipstick  Result Value Ref Range   Color, UA yellow yellow   Clarity, UA clear clear   Glucose, UA negative negative   Bilirubin, UA negative negative   Ketones, POC UA negative negative   Spec Grav, UA 1.010    Blood, UA trace-lysed (A) negative   pH, UA 6.5    Protein Ur, POC trace (A) negative   Urobilinogen, UA 0.2    Nitrite, UA Negative Negative   Leukocytes, UA moderate (2+) (A) Negative       Assessment & Plan:   1. Urinary frequency No evidence of  urinary infection. Likely due to trichomoniasis. See below. - POCT Microscopic Urinalysis ([facility]) - POCT urinalysis dipstick  2. Obesity, morbid (HCC) Encouraged regular exercise and healthy eating choices. Hopefully her new treadmill will benefit her.  3. Hypertensive CHF (congestive heart failure) (HCC) Uncontrolled due to non-compliance with medications. Resume regular meds at prescribed doses. Re-evaluate in 3 months (or sooner). - spironolactone (ALDACTONE) 50 MG tablet; Take 1 tablet (50 mg total) by mouth daily.  Dispense: 90 tablet; Refill: 3 - losartan-hydrochlorothiazide (HYZAAR) 50-12.5 MG tablet; Take 1 tablet by mouth daily.  Dispense: 90 tablet; Refill: 3 - carvedilol (COREG) 25 MG tablet; Take 1 1/2 tablets by mouth twice a day.  Dispense: 180 tablet; Refill: 3 - amLODipine (NORVASC) 10 MG tablet; Take 1 tablet (10 mg total) by mouth daily.  Dispense: 90 tablet; Refill: 3  4. Trichomonas infection Treat infection. Partner needs notification and treatment. Avoid sex until both partners have completed treatment. CONSISTENT condom use for STI reduction and contraception. Await remaining labs. Re-evaluate in  3 months. - HIV antibody - GC/Chlamydia Probe Amp - metroNIDAZOLE (FLAGYL) 500 MG tablet; Take 1 tablet (500 mg total) by mouth 2 (two) times daily with a meal. DO NOT CONSUME ALCOHOL WHILE TAKING THIS MEDICATION.  Dispense: 14 tablet; Refill: 0   Fernande Brashelle S. Alliah Boulanger, PA-C Physician Assistant-Certified [facility]

## 2017-01-27 LAB — HIV ANTIBODY (ROUTINE TESTING W REFLEX): HIV SCREEN 4TH GENERATION: NONREACTIVE

## 2017-01-29 LAB — GC/CHLAMYDIA PROBE AMP
CHLAMYDIA, DNA PROBE: NEGATIVE
Neisseria gonorrhoeae by PCR: NEGATIVE

## 2017-02-01 ENCOUNTER — Encounter: Payer: Self-pay | Admitting: Physician Assistant

## 2017-04-30 ENCOUNTER — Ambulatory Visit: Payer: Self-pay | Admitting: Physician Assistant

## 2018-06-12 ENCOUNTER — Other Ambulatory Visit: Payer: Self-pay | Admitting: Physician Assistant

## 2018-06-12 DIAGNOSIS — I110 Hypertensive heart disease with heart failure: Secondary | ICD-10-CM

## 2018-06-12 NOTE — Telephone Encounter (Signed)
spironolactone refill Last Refill:01/26/17 # 90 tab 3 RF Last OV: 01/26/17 PCP: Porfirio Oarhelle Jeffery PA Pharmacy:[facility]  Last BMP: 09/12/15

## 2019-01-01 ENCOUNTER — Ambulatory Visit (HOSPITAL_COMMUNITY)
Admission: EM | Admit: 2019-01-01 | Discharge: 2019-01-01 | Disposition: A | Discharge status: Home / Self Care | Payer: Self-pay | Attending: Family Medicine | Admitting: Family Medicine

## 2019-01-01 ENCOUNTER — Encounter (HOSPITAL_COMMUNITY): Payer: Self-pay | Admitting: Emergency Medicine

## 2019-01-01 ENCOUNTER — Other Ambulatory Visit: Payer: Self-pay

## 2019-01-01 DIAGNOSIS — B9789 Other viral agents as the cause of diseases classified elsewhere: Secondary | ICD-10-CM | POA: Insufficient documentation

## 2019-01-01 DIAGNOSIS — I110 Hypertensive heart disease with heart failure: Secondary | ICD-10-CM | POA: Insufficient documentation

## 2019-01-01 DIAGNOSIS — J069 Acute upper respiratory infection, unspecified: Secondary | ICD-10-CM | POA: Insufficient documentation

## 2019-01-01 MED ORDER — AMLODIPINE BESYLATE 10 MG PO TABS: 10.0000 mg | ORAL_TABLET | Freq: Every day | ORAL | 0 refills | Status: DC

## 2019-01-01 MED ORDER — CARVEDILOL 25 MG PO TABS: ORAL_TABLET | ORAL | 0 refills | Status: DC

## 2019-01-01 MED ORDER — BENZONATATE 200 MG PO CAPS: 200.0000 mg | ORAL_CAPSULE | Freq: Two times a day (BID) | ORAL | 0 refills | Status: DC | PRN

## 2019-01-01 MED ORDER — LOSARTAN POTASSIUM-HCTZ 50-12.5 MG PO TABS: 1.0000 | ORAL_TABLET | Freq: Every day | ORAL | 0 refills | Status: DC

## 2019-01-01 MED ORDER — SPIRONOLACTONE 50 MG PO TABS
50.0000 mg | ORAL_TABLET | Freq: Every day | ORAL | 0 refills | Status: DC
Start: 2019-01-01 — End: 2020-02-06

## 2019-01-01 MED ORDER — ONDANSETRON HCL 4 MG PO TABS: 4.0000 mg | ORAL_TABLET | Freq: Four times a day (QID) | ORAL | 0 refills | Status: DC

## 2019-01-01 NOTE — ED Provider Notes (Signed)
[facility]    CSN: [id number] Arrival date & time: 01/01/19  8022     History   Chief Complaint Chief Complaint  Patient presents with  . URI    HPI Robin May is a 33 y.o. female.   HPI Patient has some cough cold runny nose is been going on for 4 to 5 days.  No fever or chills.  Feels slightly tired.  Cough keeps her awake at night.  Clear mucus.  No fever or chills.  No body aches.  No known exposure to influenza.  She did not get a flu shot.  She does have some underlying asthma and allergies.  Today she developed nausea.  No vomiting.  She thinks is from taking so much cold medicine. Past Medical History:  Diagnosis Date  . Allergy   . Anginal pain (HCC) 08/18/2012   "mild"  . Hypertension   . Shortness of breath 08/19/2012   "w/exertion and w/lying down"    Patient Active Problem List   Diagnosis Date Noted  . Low serum high density lipoprotein (HDL) 09/22/2014  . Allergic rhinitis 09/22/2014  . Acute diastolic congestive heart failure, NYHA class 3 (HCC) 08/20/2012  . Hypertensive CHF (congestive heart failure) (HCC) 08/19/2012  . Obesity, morbid (HCC) 08/19/2012    Past Surgical History:  Procedure Laterality Date  . NO PAST SURGERIES      OB History   No obstetric history on file.      Home Medications    Prior to Admission medications   Medication Sig Start Date End Date Taking? Authorizing Provider  amLODipine (NORVASC) 10 MG tablet Take 1 tablet (10 mg total) by mouth daily. 01/01/19 01/01/20  Eustace Moore, MD  benzonatate (TESSALON) 200 MG capsule Take 1 capsule (200 mg total) by mouth 2 (two) times daily as needed for cough. 01/01/19   Eustace Moore, MD  carvedilol (COREG) 25 MG tablet Take 1 1/2 tablets by mouth twice a day. 01/01/19   Eustace Moore, MD  losartan-hydrochlorothiazide (HYZAAR) 50-12.5 MG tablet Take 1 tablet by mouth daily. 01/01/19   Eustace Moore, MD  montelukast (SINGULAIR) 10 MG tablet Take 1 tablet  (10 mg total) by mouth at bedtime. 09/15/15   Emi Belfast, FNP  ondansetron (ZOFRAN) 4 MG tablet Take 1 tablet (4 mg total) by mouth every 6 (six) hours. 01/01/19   Eustace Moore, MD  spironolactone (ALDACTONE) 50 MG tablet Take 1 tablet (50 mg total) by mouth daily. 01/01/19   Eustace Moore, MD    Family History Family History  Problem Relation Age of Onset  . Hypertension Mother   . Hypertension Maternal Grandmother   . Hypertension Maternal Grandfather   . Hypertension Paternal Grandmother   . Hypertension Paternal Grandfather   . Diabetes Father     Social History Social History   Tobacco Use  . Smoking status: Former Smoker    Types: Cigars    Last attempt to quit: 12/31/2005    Years since quitting: 13.0  . Smokeless tobacco: Never Used  . Tobacco comment: 08/19/2012 "smoked black and milds"  Substance Use Topics  . Alcohol use: Yes    Alcohol/week: 1.0 - 2.0 standard drinks    Types: 1 - 2 Glasses of wine per week    Comment: wine  . Drug use: No     Allergies   Patient has no known allergies.   Review of Systems Review of Systems  Constitutional: Negative  for chills and fever.  HENT: Positive for congestion and rhinorrhea. Negative for ear pain and sore throat.   Eyes: Negative for pain and visual disturbance.  Respiratory: Positive for cough. Negative for shortness of breath.   Cardiovascular: Negative for chest pain and palpitations.  Gastrointestinal: Negative for abdominal pain and vomiting.  Genitourinary: Negative for dysuria and hematuria.  Musculoskeletal: Negative for arthralgias and back pain.  Skin: Negative for color change and rash.  Neurological: Negative for seizures and syncope.  All other systems reviewed and are negative.    Physical Exam Triage Vital Signs ED Triage Vitals  Enc Vitals Group     BP 01/01/19 0851 (!) 153/98     Pulse Rate 01/01/19 0851 93     Resp 01/01/19 0851 20     Temp 01/01/19 0851 98.5 F (36.9 C)       Temp Source 01/01/19 0851 Oral     SpO2 01/01/19 0851 100 %     Weight --      Height --      Head Circumference --      Peak Flow --      Pain Score 01/01/19 0855 6     Pain Loc --      Pain Edu? --      Excl. in GC? --    No data found.  Updated Vital Signs BP (!) 153/98 (BP Location: Left Arm)   Pulse 93   Temp 98.5 F (36.9 C) (Oral)   Resp 20   LMP 12/17/2018   SpO2 100%       Physical Exam Constitutional:      General: She is not in acute distress.    Appearance: She is well-developed. She is obese.  HENT:     Head: Normocephalic and atraumatic.     Right Ear: Tympanic membrane and ear canal normal.     Left Ear: Tympanic membrane and ear canal normal.     Nose: Nose normal.     Mouth/Throat:     Mouth: Mucous membranes are moist.  Eyes:     Conjunctiva/sclera: Conjunctivae normal.     Pupils: Pupils are equal, round, and reactive to light.  Neck:     Musculoskeletal: Normal range of motion.  Cardiovascular:     Rate and Rhythm: Normal rate and regular rhythm.     Heart sounds: Normal heart sounds.  Pulmonary:     Effort: Pulmonary effort is normal. No respiratory distress.     Comments: Lungs are clear Abdominal:     General: There is no distension.     Palpations: Abdomen is soft.  Musculoskeletal: Normal range of motion.  Skin:    General: Skin is warm and dry.  Neurological:     Mental Status: She is alert.      UC Treatments / Results  Labs (all labs ordered are listed, but only abnormal results are displayed) Labs Reviewed - No data to display  EKG None  Radiology No results found.  Procedures Procedures (including critical care time)  Medications Ordered in UC Medications - No data to display  Initial Impression / Assessment and Plan / UC Course  I have reviewed the triage vital signs and the nursing notes.  Pertinent labs & imaging results that were available during my care of the patient were reviewed by me and  considered in my medical decision making (see chart for details).     Discussed with patient this is a viral illness.  No  indication for antibiotics.  Discussed symptomatic care Final Clinical Impressions(s) / UC Diagnoses   Final diagnoses:  Viral URI with cough     Discharge Instructions     Push fluids Take the tessalon for cough Take zofran as needed nausea Refilled BP meds Get future refills from PCP   ED Prescriptions    Medication Sig Dispense Auth. Provider   amLODipine (NORVASC) 10 MG tablet Take 1 tablet (10 mg total) by mouth daily. 90 tablet Eustace Moore, MD   carvedilol (COREG) 25 MG tablet Take 1 1/2 tablets by mouth twice a day. 180 tablet Eustace Moore, MD   losartan-hydrochlorothiazide [facility]) 50-12.5 MG tablet Take 1 tablet by mouth daily. 90 tablet Eustace Moore, MD   spironolactone (ALDACTONE) 50 MG tablet Take 1 tablet (50 mg total) by mouth daily. 90 tablet Eustace Moore, MD   ondansetron (ZOFRAN) 4 MG tablet Take 1 tablet (4 mg total) by mouth every 6 (six) hours. 12 tablet Eustace Moore, MD   benzonatate (TESSALON) 200 MG capsule Take 1 capsule (200 mg total) by mouth 2 (two) times daily as needed for cough. 20 capsule Eustace Moore, MD     Controlled Substance Prescriptions [place] Controlled Substance Registry consulted? Not Applicable   Eustace Moore, MD 01/01/19 2117

## 2019-01-01 NOTE — ED Triage Notes (Signed)
PT reports congestion and cough that started Sunday. PT has tried mucinex without relief.

## 2019-01-01 NOTE — Discharge Instructions (Addendum)
Push fluids Take the tessalon for cough Take zofran as needed nausea Refilled BP meds Get future refills from PCP

## 2019-03-26 ENCOUNTER — Ambulatory Visit (INDEPENDENT_AMBULATORY_CARE_PROVIDER_SITE_OTHER): Payer: Self-pay

## 2019-03-26 ENCOUNTER — Ambulatory Visit (HOSPITAL_COMMUNITY)
Admission: EM | Admit: 2019-03-26 | Discharge: 2019-03-26 | Disposition: A | Discharge status: Home / Self Care | Payer: Self-pay | Attending: Internal Medicine | Admitting: Internal Medicine

## 2019-03-26 ENCOUNTER — Encounter (HOSPITAL_COMMUNITY): Payer: Self-pay | Admitting: Emergency Medicine

## 2019-03-26 ENCOUNTER — Other Ambulatory Visit: Payer: Self-pay

## 2019-03-26 DIAGNOSIS — J189 Pneumonia, unspecified organism: Secondary | ICD-10-CM

## 2019-03-26 DIAGNOSIS — J181 Lobar pneumonia, unspecified organism: Secondary | ICD-10-CM

## 2019-03-26 DIAGNOSIS — R05 Cough: Secondary | ICD-10-CM

## 2019-03-26 MED ORDER — ACETAMINOPHEN 325 MG PO TABS
ORAL_TABLET | ORAL | Status: AC
  Filled 2019-03-26: qty 3

## 2019-03-26 MED ORDER — DOXYCYCLINE HYCLATE 100 MG PO CAPS: 100.0000 mg | ORAL_CAPSULE | Freq: Two times a day (BID) | ORAL | 0 refills | Status: DC

## 2019-03-26 MED ORDER — ACETAMINOPHEN 500 MG PO TABS
1000.0000 mg | ORAL_TABLET | Freq: Once | ORAL | Status: AC
  Administered 2019-03-26: 1000 mg via ORAL

## 2019-03-26 MED ORDER — AMOXICILLIN-POT CLAVULANATE ER 1000-62.5 MG PO TB12: 2.0000 | ORAL_TABLET | Freq: Two times a day (BID) | ORAL | 0 refills | Status: AC

## 2019-03-26 MED ORDER — CLONIDINE HCL 0.1 MG PO TABS
0.1000 mg | ORAL_TABLET | Freq: Once | ORAL | Status: AC
  Administered 2019-03-26: 0.1 mg via ORAL

## 2019-03-26 MED ORDER — CLONIDINE HCL 0.1 MG PO TABS
ORAL_TABLET | ORAL | Status: AC
  Filled 2019-03-26: qty 1

## 2019-03-26 NOTE — ED Provider Notes (Signed)
[facility]    CSN: [id number] Arrival date & time: 03/26/19  1251     History   Chief Complaint Chief Complaint  Patient presents with  . Cough  . Nasal Congestion    HPI Robin May is a 33 y.o. female a history of cardiomegaly, hypertension comes to urgent care with complaints of nasal congestion, wheezing and cough productive of yellowish sputum over the past 5 days.  Patient denies any subjective or objective fever.  She denies chills.  No sick contacts no travel.  In the urgent care patient had a fever of 102.9 Fahrenheit.  She denies feeling short of breath.  No nausea or vomiting.  No diarrhea.  She has pleuritic chest pain with cough.  No lower extremity swelling, orthopnea or paroxysmal nocturnal dyspnea.   HPI  Past Medical History:  Diagnosis Date  . Allergy   . Anginal pain (HCC) 08/18/2012   "mild"  . Hypertension   . Shortness of breath 08/19/2012   "w/exertion and w/lying down"    Patient Active Problem List   Diagnosis Date Noted  . Low serum high density lipoprotein (HDL) 09/22/2014  . Allergic rhinitis 09/22/2014  . Acute diastolic congestive heart failure, NYHA class 3 (HCC) 08/20/2012  . Hypertensive CHF (congestive heart failure) (HCC) 08/19/2012  . Obesity, morbid (HCC) 08/19/2012    Past Surgical History:  Procedure Laterality Date  . NO PAST SURGERIES      OB History   No obstetric history on file.      Home Medications    Prior to Admission medications   Medication Sig Start Date End Date Taking? Authorizing Provider  amLODipine (NORVASC) 10 MG tablet Take 1 tablet (10 mg total) by mouth daily. 01/01/19 01/01/20 Yes Eustace Moore, MD  carvedilol (COREG) 25 MG tablet Take 1 1/2 tablets by mouth twice a day. 01/01/19  Yes Eustace Moore, MD  losartan-hydrochlorothiazide [facility]) 50-12.5 MG tablet Take 1 tablet by mouth daily. 01/01/19  Yes Eustace Moore, MD  spironolactone (ALDACTONE) 50 MG tablet Take 1 tablet (50  mg total) by mouth daily. 01/01/19  Yes Eustace Moore, MD  benzonatate (TESSALON) 200 MG capsule Take 1 capsule (200 mg total) by mouth 2 (two) times daily as needed for cough. 01/01/19   Eustace Moore, MD  montelukast (SINGULAIR) 10 MG tablet Take 1 tablet (10 mg total) by mouth at bedtime. 09/15/15   Emi Belfast, FNP  ondansetron (ZOFRAN) 4 MG tablet Take 1 tablet (4 mg total) by mouth every 6 (six) hours. 01/01/19   Eustace Moore, MD    Family History Family History  Problem Relation Age of Onset  . Hypertension Mother   . Hypertension Maternal Grandmother   . Hypertension Maternal Grandfather   . Hypertension Paternal Grandmother   . Hypertension Paternal Grandfather   . Diabetes Father     Social History Social History   Tobacco Use  . Smoking status: Former Smoker    Types: Cigars    Last attempt to quit: 12/31/2005    Years since quitting: 13.2  . Smokeless tobacco: Never Used  . Tobacco comment: 08/19/2012 "smoked black and milds"  Substance Use Topics  . Alcohol use: Yes    Alcohol/week: 1.0 - 2.0 standard drinks    Types: 1 - 2 Glasses of wine per week    Comment: wine  . Drug use: No     Allergies   Patient has no known allergies.  Review of Systems Review of Systems  Constitutional: Negative for activity change, appetite change, chills, fatigue and fever.  HENT: Positive for congestion. Negative for postnasal drip, rhinorrhea, sinus pressure, sinus pain, sneezing and sore throat.   Eyes: Negative for pain and discharge.  Respiratory: Positive for cough, chest tightness and wheezing.   Cardiovascular: Negative for chest pain and palpitations.  Gastrointestinal: Negative for abdominal distention and abdominal pain.  Endocrine: Negative for cold intolerance.  Genitourinary: Negative for dysuria, frequency and urgency.  Musculoskeletal: Negative for arthralgias, gait problem and myalgias.  Skin: Negative for pallor and rash.  Neurological:  Negative for dizziness, syncope and light-headedness.  Psychiatric/Behavioral: Negative for agitation and confusion.  All other systems reviewed and are negative.    Physical Exam Triage Vital Signs ED Triage Vitals  Enc Vitals Group     BP 03/26/19 1344 (!) 171/121     Pulse Rate 03/26/19 1344 100     Resp 03/26/19 1344 18     Temp 03/26/19 1344 (!) 102.9 F (39.4 C)     Temp Source 03/26/19 1344 Oral     SpO2 03/26/19 1344 94 %     Weight --      Height --      Head Circumference --      Peak Flow --      Pain Score 03/26/19 1342 2     Pain Loc --      Pain Edu? --      Excl. in GC? --    No data found.  Updated Vital Signs BP (!) 171/121   Pulse 100   Temp (!) 102.9 F (39.4 C) (Oral)   Resp 18   LMP 03/02/2019   SpO2 94%   Visual Acuity Right Eye Distance:   Left Eye Distance:   Bilateral Distance:    Right Eye Near:   Left Eye Near:    Bilateral Near:     Physical Exam Constitutional:      General: She is not in acute distress.    Appearance: She is ill-appearing. She is not toxic-appearing.  HENT:     Right Ear: Tympanic membrane normal.     Left Ear: Tympanic membrane normal.     Nose: Nose normal.     Mouth/Throat:     Mouth: Mucous membranes are moist.  Eyes:     Conjunctiva/sclera: Conjunctivae normal.  Neck:     Musculoskeletal: Normal range of motion. No neck rigidity or muscular tenderness.  Cardiovascular:     Rate and Rhythm: Normal rate and regular rhythm.  Pulmonary:     Effort: No respiratory distress.     Breath sounds: No stridor. Rhonchi and rales present. No wheezing.  Chest:     Chest wall: No tenderness.  Abdominal:     General: Bowel sounds are normal. There is no distension.     Palpations: Abdomen is soft.     Tenderness: There is no abdominal tenderness. There is no guarding.  Musculoskeletal: Normal range of motion.     Right lower leg: No edema.     Left lower leg: No edema.  Skin:    General: Skin is warm.      Capillary Refill: Capillary refill takes less than 2 seconds.  Neurological:     General: No focal deficit present.     Mental Status: She is alert and oriented to person, place, and time.  Psychiatric:        Mood and Affect: Mood normal.  UC Treatments / Results  Labs (all labs ordered are listed, but only abnormal results are displayed) Labs Reviewed - No data to display  EKG None  Radiology No results found.  Procedures Procedures (including critical care time)  Medications Ordered in UC Medications  acetaminophen (TYLENOL) tablet 1,000 mg (1,000 mg Oral Given 03/26/19 1352)    Initial Impression / Assessment and Plan / UC Course  I have reviewed the triage vital signs and the nursing notes.  Pertinent labs & imaging results that were available during my care of the patient were reviewed by me and considered in my medical decision making (see chart for details).     1.  Community-acquired pneumonia: Augmentin 1 g twice daily for 5 days Doxycycline 100 mg twice daily for 7 days Tylenol/Motrin for fever/pain We will reach out to patient on Monday to assess how she is doing  2.  Hypertensive urgency: Clonidine 0.1 mg Patient advised to take her blood pressure medications when she get s home No end organ symptoms  Final Clinical Impressions(s) / UC Diagnoses   Final diagnoses:  None   Discharge Instructions   None    ED Prescriptions    None     Controlled Substance Prescriptions [place] Controlled Substance Registry consulted? No   Merrilee Jansky, MD 03/26/19 [phone]

## 2019-03-26 NOTE — ED Triage Notes (Signed)
PT reports productive cough, wheeze, and nasal congestion that started Monday.   No fevers, no subjective SOB.

## 2019-03-30 ENCOUNTER — Telehealth (HOSPITAL_COMMUNITY): Payer: Self-pay | Admitting: Internal Medicine

## 2019-03-30 NOTE — Telephone Encounter (Signed)
I spoke with Robin May this morning. She is doing better. No further febrile episodes. No SOB. Cough is better and still productive of clear sputum. Patient is hoping to return to work tomorrow if ok with her employer.

## 2020-02-05 ENCOUNTER — Ambulatory Visit: Payer: Self-pay | Attending: Internal Medicine

## 2020-02-05 DIAGNOSIS — Z20822 Contact with and (suspected) exposure to covid-19: Secondary | ICD-10-CM

## 2020-02-06 ENCOUNTER — Ambulatory Visit (INDEPENDENT_AMBULATORY_CARE_PROVIDER_SITE_OTHER)
Admission: RE | Admit: 2020-02-06 | Discharge: 2020-02-06 | Disposition: A | Discharge status: Home / Self Care | Payer: Self-pay | Source: Ambulatory Visit

## 2020-02-06 DIAGNOSIS — I10 Essential (primary) hypertension: Secondary | ICD-10-CM

## 2020-02-06 DIAGNOSIS — I110 Hypertensive heart disease with heart failure: Secondary | ICD-10-CM

## 2020-02-06 DIAGNOSIS — Z760 Encounter for issue of repeat prescription: Secondary | ICD-10-CM

## 2020-02-06 DIAGNOSIS — J3089 Other allergic rhinitis: Secondary | ICD-10-CM

## 2020-02-06 MED ORDER — AMLODIPINE BESYLATE 10 MG PO TABS: 10.0000 mg | ORAL_TABLET | Freq: Every day | ORAL | 0 refills | Status: DC

## 2020-02-06 MED ORDER — LOSARTAN POTASSIUM-HCTZ 50-12.5 MG PO TABS: 1.0000 | ORAL_TABLET | Freq: Every day | ORAL | 0 refills | Status: DC

## 2020-02-06 MED ORDER — SPIRONOLACTONE 50 MG PO TABS: 50.0000 mg | ORAL_TABLET | Freq: Every day | ORAL | 0 refills | Status: DC

## 2020-02-06 MED ORDER — CARVEDILOL 25 MG PO TABS: 37.5000 mg | ORAL_TABLET | Freq: Two times a day (BID) | ORAL | 0 refills | Status: DC

## 2020-02-06 MED ORDER — MONTELUKAST SODIUM 10 MG PO TABS: 10.0000 mg | ORAL_TABLET | Freq: Every day | ORAL | 0 refills | Status: DC

## 2020-02-06 NOTE — ED Provider Notes (Signed)
Virtual Visit via Video Note:  Robin May  initiated request for Telemedicine visit with [facility] team. I connected with Robin May  on 02/07/2020 at 9:47 AM  for a synchronized telemedicine visit using a video enabled HIPPA compliant telemedicine application. I verified that I am speaking with Robin May  using two identifiers. Robin Haber, Robin May  was physically located in a [facility] Urgent care site and Robin May was located at a different location.   The limitations of evaluation and management by telemedicine as well as the availability of in-person appointments were discussed. Patient was informed that she  may incur a bill ( including co-pay) for this virtual visit encounter. Robin May  expressed understanding and gave verbal consent to proceed with virtual visit.     History of Present Illness:Robin May  is a 34 y.o. female presents with requests for blood pressure medication refill. 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 Therefore sought care virtually today. Does not have a PCP. States she lost her insurance therefore she was recommended by her cardiologist to come to urgent care for her refills, which she has done in the past. She recently got her insurance again so plans to establish with a PCP. Has been out of her medications for 1.5 week. Does check her BP at home - 130's- 179 systolic. Notices it more towards 170's before her medication or if she hasn't taken it.  No headache, no chest pain , no dizziness, no leg swelling. No vision changes. Unfortunately we do not have recent labs.   amlodipine 10mg  daily Spironolactone 50mg  daily Losartan hctz 50-12.5 daily Carvedilol 25 mg 1.5tab twice a day.   Past Medical History:  Diagnosis Date  . Allergy   . Anginal pain (HCC) 08/18/2012   "mild"  . Hypertension   . Shortness of breath 08/19/2012   "w/exertion and  w/lying down"    No Known Allergies      Observations/Objective: Alert, oriented, non toxic in appearance. Clear coherent speech without difficulty. No increased work of breathing visualized.      Assessment and Plan: covid exposure so currently in quarantine, no active PCP, with a significant htn and chf history. Unfortunately we do not have recent labs for her. 30 day supply of refills provided but discussed with patient that she needs to be seen for next refill and emphasized importance of establishing with a PCP for management and monitoring of these medications. Patient verbalized understanding and agreeable to plan.   Follow Up Instructions:    I discussed the assessment and treatment plan with the patient. The patient was provided an opportunity to ask questions and all were answered. The patient agreed with the plan and demonstrated an understanding of the instructions.   The patient was advised to call back or seek an in-person evaluation if the symptoms worsen or if the condition fails to improve as anticipated.  I provided 15 minutes of non-face-to-face time during this encounter.    08/20/2012, Robin May  02/07/2020 9:47 AM         Robin Haber, Robin May 02/07/20 [phone]

## 2020-02-06 NOTE — Discharge Instructions (Signed)
I have provided you with 30 days of refills for your medications.  With the medications you are taking it is very important that you establish care with a primary care provider for monitoring of this medications and your labs associated with them.  Please call on Monday to establish care with a PCP in the next month.  Please be seen in person for any trend of abnormal blood pressures, headache, vision changes, palpitations, chest pain , or otherwise concerned.

## 2020-02-07 LAB — NOVEL CORONAVIRUS, NAA: SARS-CoV-2, NAA: NOT DETECTED

## 2020-03-27 ENCOUNTER — Other Ambulatory Visit: Payer: Self-pay

## 2020-03-27 ENCOUNTER — Ambulatory Visit (HOSPITAL_COMMUNITY)
Admission: EM | Admit: 2020-03-27 | Discharge: 2020-03-27 | Disposition: A | Discharge status: Home / Self Care | Payer: 59 | Attending: Family Medicine | Admitting: Family Medicine

## 2020-03-27 ENCOUNTER — Encounter (HOSPITAL_COMMUNITY): Payer: Self-pay

## 2020-03-27 ENCOUNTER — Ambulatory Visit (INDEPENDENT_AMBULATORY_CARE_PROVIDER_SITE_OTHER): Payer: 59

## 2020-03-27 DIAGNOSIS — R0989 Other specified symptoms and signs involving the circulatory and respiratory systems: Secondary | ICD-10-CM

## 2020-03-27 DIAGNOSIS — R05 Cough: Secondary | ICD-10-CM

## 2020-03-27 DIAGNOSIS — R059 Cough, unspecified: Secondary | ICD-10-CM

## 2020-03-27 NOTE — ED Provider Notes (Signed)
[facility]    CSN: [id number] Arrival date & time: 03/27/20  1024      History   Chief Complaint Chief Complaint  Patient presents with  . Cough    HPI Robin May is a 34 y.o. female.   HPI  Patient has a history of uncontrolled hypertension, hypertensive cardiomyopathy, congestive heart failure, noncompliance. She states she is taking Alka-Seltzer plus for her cold symptoms She thinks this is why her blood pressure is elevated today  she is not having any headache, dizziness, or chest pain she is here for cough with clear sputum for 3 days  She is scheduled to fly to [place] for vacation on the weekend She does not want a Covid test today, because she is scheduled to have one for travel tomorrow.  If 1 is done today will be longer than 3 days before her flight takes off, and will not be valid for her vacation She has no fever or chills No headache or body ache No loss of taste or smell No known exposure to Covid She wants to be "checked out" make sure it safe for her to fly She states she is taking her hyper tensive medications and her heart medicines  Past Medical History:  Diagnosis Date  . Allergy   . Anginal pain (HCC) 08/18/2012   "mild"  . Hypertension   . Shortness of breath 08/19/2012   "w/exertion and w/lying down"    Patient Active Problem List   Diagnosis Date Noted  . Low serum high density lipoprotein (HDL) 09/22/2014  . Allergic rhinitis 09/22/2014  . Acute diastolic congestive heart failure, NYHA class 3 (HCC) 08/20/2012  . Hypertensive CHF (congestive heart failure) (HCC) 08/19/2012  . Obesity, morbid (HCC) 08/19/2012    Past Surgical History:  Procedure Laterality Date  . NO PAST SURGERIES      OB History   No obstetric history on file.      Home Medications    Prior to Admission medications   Medication Sig Start Date End Date Taking? Authorizing Provider  amLODipine (NORVASC) 10 MG tablet Take 1 tablet (10 mg  total) by mouth daily. 02/06/20 03/07/20  Georgetta Haber, NP  carvedilol (COREG) 25 MG tablet Take 1.5 tablets (37.5 mg total) by mouth 2 (two) times daily with a meal. Take 1 1/2 tablets by mouth twice a day. 02/06/20 03/07/20  Georgetta Haber, NP  losartan-hydrochlorothiazide (HYZAAR) 50-12.5 MG tablet Take 1 tablet by mouth daily. 02/06/20 03/07/20  Georgetta Haber, NP  montelukast (SINGULAIR) 10 MG tablet Take 1 tablet (10 mg total) by mouth at bedtime. 02/06/20   Georgetta Haber, NP  spironolactone (ALDACTONE) 50 MG tablet Take 1 tablet (50 mg total) by mouth daily. 02/06/20 03/07/20  Georgetta Haber, NP    Family History Family History  Problem Relation Age of Onset  . Hypertension Mother   . Hypertension Maternal Grandmother   . Hypertension Maternal Grandfather   . Hypertension Paternal Grandmother   . Hypertension Paternal Grandfather   . Diabetes Father     Social History Social History   Tobacco Use  . Smoking status: Former Smoker    Types: Cigars    Quit date: 12/31/2005    Years since quitting: 14.2  . Smokeless tobacco: Never Used  . Tobacco comment: 08/19/2012 "smoked black and milds"  Substance Use Topics  . Alcohol use: Not Currently    Alcohol/week: 1.0 - 2.0 standard drinks    Types:  1 - 2 Glasses of wine per week    Comment: wine  . Drug use: No     Allergies   Patient has no known allergies.   Review of Systems Review of Systems  Constitutional: Positive for chills. Negative for diaphoresis, fatigue and fever.  HENT: Positive for congestion.   Eyes: Negative for visual disturbance.  Respiratory: Positive for cough and shortness of breath.        Chronic SOB, cough with clear mucous  Musculoskeletal: Negative for myalgias.  Neurological: Negative for headaches.     Physical Exam Triage Vital Signs ED Triage Vitals  Enc Vitals Group     BP 03/27/20 1102 (!) 180/130     Pulse Rate 03/27/20 1102 (!) 106     Resp 03/27/20 1102 18     Temp 03/27/20 1102  98.5 F (36.9 C)     Temp Source 03/27/20 1102 Oral     SpO2 03/27/20 1102 97 %     Weight 03/27/20 1103 252 lb (114.3 kg)     Height 03/27/20 1103 5\' 3"  (1.6 m)     Head Circumference --      Peak Flow --      Pain Score 03/27/20 1103 0     Pain Loc --      Pain Edu? --      Excl. in [place]? --    No data found.  Updated Vital Signs BP (!) 180/130   Pulse (!) 106   Temp 98.5 F (36.9 C) (Oral)   Resp 18   Ht 5\' 3"  (1.6 m)   Wt 114.3 kg   LMP 03/13/2020 (Exact Date)   SpO2 97%   BMI 44.64 kg/m       Physical Exam Vitals reviewed.  Constitutional:      General: She is not in acute distress.    Appearance: She is well-developed. She is obese.  HENT:     Head: Normocephalic and atraumatic.     Mouth/Throat:     Mouth: Mucous membranes are moist.     Pharynx: No posterior oropharyngeal erythema.  Eyes:     Conjunctiva/sclera: Conjunctivae normal.     Pupils: Pupils are equal, round, and reactive to light.  Cardiovascular:     Rate and Rhythm: Tachycardia present.     Heart sounds: Normal heart sounds.  Pulmonary:     Effort: Pulmonary effort is normal. No respiratory distress.     Comments: Decreased BS Abdominal:     General: There is no distension.     Palpations: Abdomen is soft.  Musculoskeletal:        General: Normal range of motion.     Cervical back: Normal range of motion.  Skin:    General: Skin is warm and dry.  Neurological:     Mental Status: She is alert.  Psychiatric:        Mood and Affect: Mood normal.        Behavior: Behavior normal.      UC Treatments / Results  Labs (all labs ordered are listed, but only abnormal results are displayed) Labs Reviewed - No data to display  EKG   Radiology DG Chest 2 View  Result Date: 03/27/2020 CLINICAL DATA:  Dry cough. Chest congestion. EXAM: CHEST - 2 VIEW COMPARISON:  Chest radiograph 03/26/2019. FINDINGS: Stable marked cardiomegaly. Tortuosity of the thoracic aorta. No large area pulmonary  consolidation. Pulmonary vascular redistribution. No pleural effusion or pneumothorax. IMPRESSION: Cardiomegaly.  Pulmonary vascular redistribution.  Electronically Signed   By: Annia Belt M.D.   On: 03/27/2020 11:59    Procedures Procedures (including critical care time)  Medications Ordered in UC Medications - No data to display  Initial Impression / Assessment and Plan / UC Course  I have reviewed the triage vital signs and the nursing notes.  Pertinent labs & imaging results that were available during my care of the patient were reviewed by me and considered in my medical decision making (see chart for details).     Discussed that CDC recommends against travel unless urgent Patient is at increased risk from complications COVID Final Clinical Impressions(s) / UC Diagnoses   Final diagnoses:  Cough     Discharge Instructions     Chest x ray shows no evidence of fluid overload or infection Take OTC cough medicine like mucinex DM or Robitussin Avoid decongestants , will increase Blood Pressure Call for problems    ED Prescriptions    None     PDMP not reviewed this encounter.   Eustace Moore, MD 03/27/20 1245

## 2020-03-27 NOTE — ED Notes (Signed)
Notified Mani, PA of pt's bp °

## 2020-03-27 NOTE — ED Triage Notes (Signed)
Pt c/o productive cough w/clear mucousx3 days. Pt states she is having a little bit of SOB and she has been taking Robitussin DM OTC to help break up the mucous. Pt has non labored breathing. Skin color is WNL. Skin is dry. Pt has diminished lung sounds.

## 2020-03-27 NOTE — Discharge Instructions (Addendum)
Chest x ray shows no evidence of fluid overload or infection Take OTC cough medicine like mucinex DM or Robitussin Avoid decongestants , will increase Blood Pressure Call for problems

## 2020-03-28 ENCOUNTER — Ambulatory Visit: Payer: 59 | Attending: Internal Medicine

## 2020-03-28 DIAGNOSIS — Z20822 Contact with and (suspected) exposure to covid-19: Secondary | ICD-10-CM

## 2020-03-29 LAB — NOVEL CORONAVIRUS, NAA: SARS-CoV-2, NAA: NOT DETECTED

## 2020-03-29 LAB — SARS-COV-2, NAA 2 DAY TAT

## 2020-05-26 ENCOUNTER — Other Ambulatory Visit: Payer: Self-pay

## 2020-05-26 ENCOUNTER — Ambulatory Visit (HOSPITAL_COMMUNITY)
Admission: EM | Admit: 2020-05-26 | Discharge: 2020-05-26 | Disposition: A | Discharge status: Home / Self Care | Payer: 59 | Attending: Physician Assistant | Admitting: Physician Assistant

## 2020-05-26 ENCOUNTER — Encounter (HOSPITAL_COMMUNITY): Payer: Self-pay | Admitting: Emergency Medicine

## 2020-05-26 DIAGNOSIS — I10 Essential (primary) hypertension: Secondary | ICD-10-CM | POA: Diagnosis not present

## 2020-05-26 DIAGNOSIS — N189 Chronic kidney disease, unspecified: Secondary | ICD-10-CM | POA: Diagnosis not present

## 2020-05-26 DIAGNOSIS — I110 Hypertensive heart disease with heart failure: Secondary | ICD-10-CM

## 2020-05-26 DIAGNOSIS — Z760 Encounter for issue of repeat prescription: Secondary | ICD-10-CM

## 2020-05-26 DIAGNOSIS — Z87891 Personal history of nicotine dependence: Secondary | ICD-10-CM | POA: Diagnosis not present

## 2020-05-26 DIAGNOSIS — Z79899 Other long term (current) drug therapy: Secondary | ICD-10-CM | POA: Diagnosis not present

## 2020-05-26 DIAGNOSIS — I43 Cardiomyopathy in diseases classified elsewhere: Secondary | ICD-10-CM | POA: Diagnosis not present

## 2020-05-26 DIAGNOSIS — Z8249 Family history of ischemic heart disease and other diseases of the circulatory system: Secondary | ICD-10-CM | POA: Diagnosis not present

## 2020-05-26 DIAGNOSIS — I130 Hypertensive heart and chronic kidney disease with heart failure and stage 1 through stage 4 chronic kidney disease, or unspecified chronic kidney disease: Secondary | ICD-10-CM | POA: Diagnosis not present

## 2020-05-26 DIAGNOSIS — J3089 Other allergic rhinitis: Secondary | ICD-10-CM | POA: Insufficient documentation

## 2020-05-26 DIAGNOSIS — Z20822 Contact with and (suspected) exposure to covid-19: Secondary | ICD-10-CM | POA: Insufficient documentation

## 2020-05-26 DIAGNOSIS — J0100 Acute maxillary sinusitis, unspecified: Secondary | ICD-10-CM | POA: Diagnosis not present

## 2020-05-26 LAB — COMPREHENSIVE METABOLIC PANEL
ALT: 23 U/L (ref 0–44)
AST: 18 U/L (ref 15–41)
Albumin: 3.3 g/dL — ABNORMAL LOW (ref 3.5–5.0)
Alkaline Phosphatase: 36 U/L — ABNORMAL LOW (ref 38–126)
Anion gap: 9 (ref 5–15)
BUN: 16 mg/dL (ref 6–20)
CO2: 25 mmol/L (ref 22–32)
Calcium: 9.1 mg/dL (ref 8.9–10.3)
Chloride: 103 mmol/L (ref 98–111)
Creatinine, Ser: 1.59 mg/dL — ABNORMAL HIGH (ref 0.44–1.00)
GFR calc Af Amer: 49 mL/min — ABNORMAL LOW (ref >60)
GFR calc non Af Amer: 43 mL/min — ABNORMAL LOW (ref >60)
Glucose, Bld: 138 mg/dL — ABNORMAL HIGH (ref 70–99)
Potassium: 3.4 mmol/L — ABNORMAL LOW (ref 3.5–5.1)
Sodium: 137 mmol/L (ref 135–145)
Total Bilirubin: 0.7 mg/dL (ref 0.3–1.2)
Total Protein: 7 g/dL (ref 6.5–8.1)

## 2020-05-26 MED ORDER — AMOXICILLIN-POT CLAVULANATE 875-125 MG PO TABS: 1.0000 | ORAL_TABLET | Freq: Two times a day (BID) | ORAL | 0 refills | Status: AC

## 2020-05-26 MED ORDER — SPIRONOLACTONE 50 MG PO TABS: 50.0000 mg | ORAL_TABLET | Freq: Every day | ORAL | 0 refills | Status: DC

## 2020-05-26 MED ORDER — FLUTICASONE PROPIONATE 50 MCG/ACT NA SUSP: 1.0000 | Freq: Every day | NASAL | 1 refills | Status: DC

## 2020-05-26 MED ORDER — CETIRIZINE HCL 10 MG PO TABS: 10.0000 mg | ORAL_TABLET | Freq: Every day | ORAL | 0 refills | Status: DC

## 2020-05-26 MED ORDER — AMLODIPINE BESYLATE 10 MG PO TABS: 10.0000 mg | ORAL_TABLET | Freq: Every day | ORAL | 0 refills | Status: DC

## 2020-05-26 MED ORDER — MONTELUKAST SODIUM 10 MG PO TABS: 10.0000 mg | ORAL_TABLET | Freq: Every day | ORAL | 0 refills | Status: DC

## 2020-05-26 MED ORDER — SALINE SPRAY 0.65 % NA SOLN: 1.0000 | NASAL | 0 refills | Status: AC | PRN

## 2020-05-26 MED ORDER — LOSARTAN POTASSIUM-HCTZ 50-12.5 MG PO TABS: 1.0000 | ORAL_TABLET | Freq: Every day | ORAL | 0 refills | Status: DC

## 2020-05-26 MED ORDER — CARVEDILOL 25 MG PO TABS: 37.5000 mg | ORAL_TABLET | Freq: Two times a day (BID) | ORAL | 0 refills | Status: DC

## 2020-05-26 MED ORDER — BENZONATATE 100 MG PO CAPS: 100.0000 mg | ORAL_CAPSULE | Freq: Three times a day (TID) | ORAL | 0 refills | Status: DC

## 2020-05-26 NOTE — ED Triage Notes (Signed)
Pt c/o productive cough and sinus drainage x 1 month. Pt states it has gotten better since last visit but she is outside most of the day and her allergies have gotten worse. Pt also needs a refill on blood pressure medicine. She took her last pill this morning.

## 2020-05-26 NOTE — Discharge Instructions (Addendum)
I have refilled your medicines restart them as prescribed. Continue to monitor blood pressure at home  For your sinus symptoms start the Augmentin today, this is twice a day for 7 days Continue Zyrtec, restart the montelukast at night Use the nasal saline throughout the day Stop the Robitussin during the day and take the Tessalon for cough.  Call the internal medicine center today to establish care with the clinic.  I have also supplied other options however like for you to start with the internal medicine center. Call your cardiologist office to reestablish care  I have sent basic labs we will call you if there are any necessary changes to your treatment  If you have shortness of breath, chest pain, dizziness severe headache or change in vision please report to the emergency department  If your Covid-19 test is positive, you will receive a phone call from [facility] regarding your results. Negative test results are not called. Both positive and negative results area always visible on MyChart. If you do not have a MyChart account, sign up instructions are in your discharge papers.   Persons who are directed to care for themselves at home may discontinue isolation under the following conditions:   At least 10 days have passed since symptom onset and  At least 24 hours have passed without running a fever (this means without the use of fever-reducing medications) and  Other symptoms have improved.  Persons infected with COVID-19 who never develop symptoms may discontinue isolation and other precautions 10 days after the date of their first positive COVID-19 test.

## 2020-05-26 NOTE — ED Provider Notes (Signed)
[facility]    CSN: [id number] Arrival date & time: 05/26/20  4627      History   Chief Complaint Chief Complaint  Patient presents with  . Sinusitis  . Medication Refill    HPI Robin May is a 34 y.o. female.   Patient with history of hypertension and hypertensive cardiomyopathy with hypertensive CHF presents for medication refill and complaint of 1 month sinus congestion with recent 1 week history of worsening congestion and nasal discharge.  With regard to her sinus symptoms she reports she has chronic issues with allergies however over the last week she has had significantly worsening nasal discharge and color change to green.  She reports facial pain and headaches with this.  She reports she previously had a cough however this is resolved with over-the-counter medicines such as Robitussin.  She reports she takes Zyrtec daily and was doing montelukast however ran out of this medicine.  She denies shortness of breath, chest pain, sore throat.  Had subjective fever a few days ago however has not had 1 since then.  Denies chills or body ache.  Currently denying headaches, vision changes or dizziness.  Denies upset stomach nausea or vomiting.  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    She reports she took her last doses of blood pressure medicines this morning.  She reports she has not had a primary care in some time due to insurance issues.  She had medications last refilled in this clinic.  She reports she was taking all the medicines as previously prescribed.  She reports she is taking amlodipine 10 mg, carvedilol 25 mg 1-1/2 tablets twice a day, spironolactone 50 mg daily, losartan hydrochlorothiazide 50-12.5 daily.  She reports she was also taking the montelukast at night however is not had this medication for a while.  She reports she takes her blood pressures at home and is fairly well controlled with the above regiment.  She denies any lower  extremity swelling and does not monitor her weight.  Does not note any weight gain.  She has not followed with her cardiologist in some time due to insurance issues.  She reports it was recommended that she continue to follow-up with urgent cares until she has insurance resolved.     Past Medical History:  Diagnosis Date  . Allergy   . Anginal pain (HCC) 08/18/2012   "mild"  . Hypertension   . Shortness of breath 08/19/2012   "w/exertion and w/lying down"    Patient Active Problem List   Diagnosis Date Noted  . Low serum high density lipoprotein (HDL) 09/22/2014  . Allergic rhinitis 09/22/2014  . Acute diastolic congestive heart failure, NYHA class 3 (HCC) 08/20/2012  . Hypertensive CHF (congestive heart failure) (HCC) 08/19/2012  . Obesity, morbid (HCC) 08/19/2012    Past Surgical History:  Procedure Laterality Date  . NO PAST SURGERIES      OB History   No obstetric history on file.      Home Medications    Prior to Admission medications   Medication Sig Start Date End Date Taking? Authorizing Provider  amLODipine (NORVASC) 10 MG tablet Take 1 tablet (10 mg total) by mouth daily. 05/26/20 06/25/20  Pieper Kasik, Veryl Speak, PA-C  amoxicillin-clavulanate (AUGMENTIN) 875-125 MG tablet Take 1 tablet by mouth 2 (two) times daily for 7 days. 05/26/20 06/02/20  Kennetta Pavlovic, Veryl Speak, PA-C  benzonatate (TESSALON) 100 MG capsule Take 1 capsule (100 mg total) by  mouth every 8 (eight) hours. 05/26/20   Lowen Mansouri, Veryl Speak, PA-C  carvedilol (COREG) 25 MG tablet Take 1.5 tablets (37.5 mg total) by mouth 2 (two) times daily with a meal. Take 1 1/2 tablets by mouth twice a day. 05/26/20 06/25/20  Lajoyce Tamura, Veryl Speak, PA-C  cetirizine (ZYRTEC ALLERGY) 10 MG tablet Take 1 tablet (10 mg total) by mouth daily. 05/26/20   Sravya Grissom, Veryl Speak, PA-C  fluticasone (FLONASE) 50 MCG/ACT nasal spray Place 1 spray into both nostrils daily. 05/26/20   Arlina Sabina, Veryl Speak, PA-C  losartan-hydrochlorothiazide (HYZAAR) 50-12.5 MG tablet Take 1 tablet by  mouth daily. 05/26/20 06/25/20  Azaryah Oleksy, Veryl Speak, PA-C  montelukast (SINGULAIR) 10 MG tablet Take 1 tablet (10 mg total) by mouth at bedtime. 05/26/20   Raynell Scott, Veryl Speak, PA-C  sodium chloride (OCEAN) 0.65 % SOLN nasal spray Place 1 spray into both nostrils as needed for congestion. 05/26/20   Yousra Ivens, Veryl Speak, PA-C  spironolactone (ALDACTONE) 50 MG tablet Take 1 tablet (50 mg total) by mouth daily. 05/26/20 06/25/20  Geovanni Rahming, Veryl Speak, PA-C    Family History Family History  Problem Relation Age of Onset  . Hypertension Mother   . Hypertension Maternal Grandmother   . Hypertension Maternal Grandfather   . Hypertension Paternal Grandmother   . Hypertension Paternal Grandfather   . Diabetes Father     Social History Social History   Tobacco Use  . Smoking status: Former Smoker    Types: Cigars    Quit date: 12/31/2005    Years since quitting: 14.4  . Smokeless tobacco: Never Used  . Tobacco comment: 08/19/2012 "smoked black and milds"  Substance Use Topics  . Alcohol use: Not Currently    Alcohol/week: 1.0 - 2.0 standard drinks    Types: 1 - 2 Glasses of wine per week    Comment: wine  . Drug use: No     Allergies   Patient has no known allergies.   Review of Systems Review of Systems   Physical Exam Triage Vital Signs ED Triage Vitals  Enc Vitals Group     BP      Pulse      Resp      Temp      Temp src      SpO2      Weight      Height      Head Circumference      Peak Flow      Pain Score      Pain Loc      Pain Edu?      Excl. in [place]?    No data found.  Updated Vital Signs BP (!) 157/98 (BP Location: Left Arm)   Pulse 75   Temp 98.3 F (36.8 C) (Oral)   Resp 14   SpO2 100%   Visual Acuity Right Eye Distance:   Left Eye Distance:   Bilateral Distance:    Right Eye Near:   Left Eye Near:    Bilateral Near:     Physical Exam Vitals and nursing note reviewed.  Constitutional:      General: She is not in acute distress.    Appearance: She is well-developed.  She is not ill-appearing.  HENT:     Head: Normocephalic and atraumatic.     Right Ear: Tympanic membrane normal.     Left Ear: Tympanic membrane normal.     Nose:     Comments: Bilateral swollen edematous turbinates.  There is also maxillary  facial tenderness Eyes:     Conjunctiva/sclera: Conjunctivae normal.  Cardiovascular:     Rate and Rhythm: Normal rate and regular rhythm.     Heart sounds: No murmur.  Pulmonary:     Effort: Pulmonary effort is normal. No respiratory distress.     Breath sounds: Normal breath sounds.  Abdominal:     Palpations: Abdomen is soft.     Tenderness: There is no abdominal tenderness.  Musculoskeletal:     Cervical back: Neck supple.  Skin:    General: Skin is warm and dry.  Neurological:     Mental Status: She is alert.      UC Treatments / Results  Labs (all labs ordered are listed, but only abnormal results are displayed) Labs Reviewed  COMPREHENSIVE METABOLIC PANEL - Abnormal; Notable for the following components:      Result Value   Potassium 3.4 (*)    Glucose, Bld 138 (*)    Creatinine, Ser 1.59 (*)    Albumin 3.3 (*)    Alkaline Phosphatase 36 (*)    GFR calc non Af Amer 43 (*)    GFR calc Af Amer 49 (*)    All other components within normal limits  SARS CORONAVIRUS 2 (TAT 6-24 HRS)    EKG   Radiology No results found.  Procedures Procedures (including critical care time)  Medications Ordered in UC Medications - No data to display  Initial Impression / Assessment and Plan / UC Course  I have reviewed the triage vital signs and the nursing notes.  Pertinent labs & imaging results that were available during my care of the patient were reviewed by me and considered in my medical decision making (see chart for details).     #Maxillary sinusitis #Hypertension #Medication refill #Chronic kidney disease Patient is a 34 year old history of hypertension and hypertensive cardiomyopathy presenting with symptoms  consistent with sinusitis and need for medication refill.  Patient has not had basic labs drawn in some time.  Labs do show creatinine up from previous at 1.59.  Last in 2016 was 0.92.  Creatinine clearance based on current weights roughly 90 mL/min based on Cockcroft-Gault.  She also has elevated glucose however this nondiagnostic for diabetes at this time.  I discussed the patient's labs with her in the importance of following up with primary care for better maintenance of her blood pressure and chronic health conditions.  Refill her blood pressure medications here.  Also started on Augmentin, based on creatinine clearance regular dosing is appropriate.  Recommended other nasal symptoms symptomatic care.  Patient verbalized understanding of the seriousness of her chronic health issues.  Verbalized she will follow-up with primary care.  Final Clinical Impressions(s) / UC Diagnoses   Final diagnoses:  Acute maxillary sinusitis, recurrence not specified  Hypertension, unspecified type  Medication refill     Discharge Instructions     I have refilled your medicines restart them as prescribed. Continue to monitor blood pressure at home  For your sinus symptoms start the Augmentin today, this is twice a day for 7 days Continue Zyrtec, restart the montelukast at night Use the nasal saline throughout the day Stop the Robitussin during the day and take the Tessalon for cough.  Call the internal medicine center today to establish care with the clinic.  I have also supplied other options however like for you to start with the internal medicine center. Call your cardiologist office to reestablish care  I have sent basic labs we will call  you if there are any necessary changes to your treatment  If you have shortness of breath, chest pain, dizziness severe headache or change in vision please report to the emergency department  If your Covid-19 test is positive, you will receive a phone call from  [facility] regarding your results. Negative test results are not called. Both positive and negative results area always visible on MyChart. If you do not have a MyChart account, sign up instructions are in your discharge papers.   Persons who are directed to care for themselves at home may discontinue isolation under the following conditions:  . At least 10 days have passed since symptom onset and . At least 24 hours have passed without running a fever (this means without the use of fever-reducing medications) and . Other symptoms have improved.  Persons infected with COVID-19 who never develop symptoms may discontinue isolation and other precautions 10 days after the date of their first positive COVID-19 test.     ED Prescriptions    Medication Sig Dispense Auth. Provider   spironolactone (ALDACTONE) 50 MG tablet Take 1 tablet (50 mg total) by mouth daily. 30 tablet Mckenzy Salazar, Marguerita Beards, PA-C   losartan-hydrochlorothiazide (HYZAAR) 50-12.5 MG tablet Take 1 tablet by mouth daily. 30 tablet Joe Tanney, Marguerita Beards, PA-C   carvedilol (COREG) 25 MG tablet Take 1.5 tablets (37.5 mg total) by mouth 2 (two) times daily with a meal. Take 1 1/2 tablets by mouth twice a day. 90 tablet Giovannina Mun, Marguerita Beards, PA-C   amLODipine (NORVASC) 10 MG tablet Take 1 tablet (10 mg total) by mouth daily. 30 tablet Wilson Dusenbery, Marguerita Beards, PA-C   montelukast (SINGULAIR) 10 MG tablet Take 1 tablet (10 mg total) by mouth at bedtime. 30 tablet Lilian Fuhs, Marguerita Beards, PA-C   amoxicillin-clavulanate (AUGMENTIN) 875-125 MG tablet Take 1 tablet by mouth 2 (two) times daily for 7 days. 14 tablet Kellyann Ordway, Marguerita Beards, PA-C   cetirizine (ZYRTEC ALLERGY) 10 MG tablet Take 1 tablet (10 mg total) by mouth daily. 30 tablet Lometa Riggin, Marguerita Beards, PA-C   sodium chloride (OCEAN) 0.65 % SOLN nasal spray Place 1 spray into both nostrils as needed for congestion. 44 mL Kianni Lheureux, Marguerita Beards, PA-C   fluticasone (FLONASE) 50 MCG/ACT nasal spray Place 1 spray into both nostrils daily. 15.8 mL Lousie Calico,  Marguerita Beards, PA-C   benzonatate (TESSALON) 100 MG capsule Take 1 capsule (100 mg total) by mouth every 8 (eight) hours. 21 capsule Wisdom Rickey, Marguerita Beards, PA-C     PDMP not reviewed this encounter.   Purnell Shoemaker, PA-C 05/26/20 2042

## 2020-05-27 LAB — SARS CORONAVIRUS 2 (TAT 6-24 HRS): SARS Coronavirus 2: NEGATIVE

## 2020-07-13 ENCOUNTER — Other Ambulatory Visit: Payer: Self-pay

## 2020-07-13 ENCOUNTER — Encounter: Payer: Self-pay | Admitting: Family Medicine

## 2020-07-13 ENCOUNTER — Ambulatory Visit (INDEPENDENT_AMBULATORY_CARE_PROVIDER_SITE_OTHER): Payer: 59 | Admitting: Family Medicine

## 2020-07-13 VITALS — BP 145/90 | HR 75 | Ht 63.0 in | Wt 278.4 lb

## 2020-07-13 DIAGNOSIS — I5032 Chronic diastolic (congestive) heart failure: Secondary | ICD-10-CM | POA: Insufficient documentation

## 2020-07-13 DIAGNOSIS — J3089 Other allergic rhinitis: Secondary | ICD-10-CM

## 2020-07-13 DIAGNOSIS — Z7689 Persons encountering health services in other specified circumstances: Secondary | ICD-10-CM | POA: Diagnosis not present

## 2020-07-13 DIAGNOSIS — I10 Essential (primary) hypertension: Secondary | ICD-10-CM | POA: Diagnosis not present

## 2020-07-13 DIAGNOSIS — Z1159 Encounter for screening for other viral diseases: Secondary | ICD-10-CM | POA: Insufficient documentation

## 2020-07-13 MED ORDER — SPIRONOLACTONE 50 MG PO TABS: 50.0000 mg | ORAL_TABLET | Freq: Every day | ORAL | 0 refills | Status: DC

## 2020-07-13 MED ORDER — CARVEDILOL 25 MG PO TABS: 37.5000 mg | ORAL_TABLET | Freq: Two times a day (BID) | ORAL | 0 refills | Status: DC

## 2020-07-13 MED ORDER — LOSARTAN POTASSIUM-HCTZ 50-12.5 MG PO TABS: 1.0000 | ORAL_TABLET | Freq: Every day | ORAL | 0 refills | Status: DC

## 2020-07-13 MED ORDER — AMLODIPINE BESYLATE 10 MG PO TABS: 10.0000 mg | ORAL_TABLET | Freq: Every day | ORAL | 0 refills | Status: DC

## 2020-07-13 MED FILL — AMLODIPINE BESYLATE 10 MG T: 10 | 30 days supply | Qty: 30 | Fill #0

## 2020-07-13 MED FILL — CARVEDILOL 25 MG TABLET: 25 | 30 days supply | Qty: 90 | Fill #0

## 2020-07-13 MED FILL — LOSARTAN-HCTZ 50-12.5 MG TA: 50-12.5 | 30 days supply | Qty: 30 | Fill #0

## 2020-07-13 MED FILL — SPIRONOLACTONE 50 MG TABS: 50 | 30 days supply | Qty: 30 | Fill #0

## 2020-07-13 NOTE — Assessment & Plan Note (Signed)
Patient with hypertension, BP today is not well controlled, but she has not been regularly taking her medications to make them last longer.  Provided refills for her medications, will also obtain BMP today especially since she is on spironolactone and losartan.  When she is on this medication consistently, can make further changes to hypertensive regimen as needed.

## 2020-07-13 NOTE — Assessment & Plan Note (Signed)
No signs of fluid overload on exam and patient has been doing well.  She is currently on Coreg, losartan, spironolactone.  She is requesting referral back to Dr. Jacinto Halim.  Referral placed.  Patient advised that unclear at this time if he will be able to take her new insurance, either way she would like to establish with a cardiologist.  She would likely benefit from a repeat echo.

## 2020-07-13 NOTE — Assessment & Plan Note (Signed)
Patient congratulated on her new workout regimen.  Continue to encourage healthy diet and exercise.  Could consider referral to healthy weight and wellness in the future.

## 2020-07-13 NOTE — Patient Instructions (Signed)
Thank you for coming to see me today. It was a pleasure. Today we talked about:   It was great to meet you!    I have placed a referral to Cardiology for your heart.  If you do not hear from them in the next 2 weeks, please give [place] a call.  We will get some labs today.  If they are abnormal or we need to do something about them, I will call you.  If they are normal, I will send you a message on MyChart (if it is active) or a letter in the mail.  If you don't hear from [place] in 2 weeks, please call the office at the number below.  Please follow-up with me in 1 month.  If you have any questions or concerns, please do not hesitate to call the office at [phone].  Best,   Luis Abed, DO

## 2020-07-13 NOTE — Progress Notes (Signed)
    SUBJECTIVE:   CHIEF COMPLAINT / HPI:   Establish Care Patient presents to establish care She did not have insurance for many years She had previously seen Dr. 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 Jacinto Halim, but hasn't seen since 2017 because she didn't have insurance She is taking spironolactone 50mg  QD, Coreg 25mg  1.5 tablets BID, Losartan-HCTZ 50-12.5mg  QD, Norvasc 10mg  QD No chest pain, shortness of breath, leg edema Last Echo in 2013 in chart, patient thinks this was her last Echo, with EF 45-50% and G3DD She had not been getting full supplies of medications from Urgent Care so she was "spreading them out" and not taking them daily Has started walking at the track  Allergic Rhinitis Was prescribed Singulair, Flonase, zyrtec Not taking now because allergies are okay  Obesity Patient has recently started to walk at a track to exercise more She is also getting in a sauna  PERTINENT  PMH / PSH: HTN, CHF  OBJECTIVE:   BP (!) 145/90   Pulse 75   Ht 5\' 3"  (1.6 m)   Wt 278 lb 6.4 oz (126.3 kg)   LMP 07/09/2020 (Exact Date)   SpO2 97%   BMI 49.32 kg/m    Physical Exam:  General: 34 y.o. female in NAD Cardio: RRR no m/r/g Lungs: CTAB, no wheezing, no rhonchi, no crackles, no IWOB on RA Skin: warm and dry Extremities: No edema, 2+ dorsalis pedis pulses   ASSESSMENT/PLAN:   Encounter to establish care with new doctor New patient packet reviewed with patient.  Patient's past medical, surgical, family, social history and medications updated and reviewed in chart.  Patient advised that she is overdue for a Pap smear.  Will obtain labs today and go ahead and add on a hepatitis C for screening as well.  Essential hypertension Patient with hypertension, BP today  is not well controlled, but she has not been regularly taking her medications to make them last longer.  Provided refills for her medications, will also obtain BMP today especially since she is on spironolactone and losartan.  When she is on this medication consistently, can make further changes to hypertensive regimen as needed.  Chronic diastolic congestive heart failure (HCC) No signs of fluid overload on exam and patient has been doing well.  She is currently on Coreg, losartan, spironolactone.  She is requesting referral back to Dr. 2014.  Referral placed.  Patient advised that unclear at this time if he will be able to take her new insurance, either way she would like to establish with a cardiologist.  She would likely benefit from a repeat echo.  Morbid obesity (HCC) Patient congratulated on her new workout regimen.  Continue to encourage healthy diet and exercise.  Could consider referral to healthy weight and wellness in the future.  Perennial allergic rhinitis Patient does not currently have any symptoms and is not currently taking these medications.  No Rx needed at that time.     , DO [facility]

## 2020-07-13 NOTE — Assessment & Plan Note (Signed)
New patient packet reviewed with patient.  Patient's past medical, surgical, family, social history and medications updated and reviewed in chart.  Patient advised that she is overdue for a Pap smear.  Will obtain labs today and go ahead and add on a hepatitis C for screening as well.

## 2020-07-14 LAB — BASIC METABOLIC PANEL
BUN/Creatinine Ratio: 13 (ref 9–23)
BUN: 19 mg/dL (ref 6–20)
CO2: 20 mmol/L (ref 20–29)
Calcium: 9.8 mg/dL (ref 8.7–10.2)
Chloride: 100 mmol/L (ref 96–106)
Creatinine, Ser: 1.45 mg/dL — ABNORMAL HIGH (ref 0.57–1.00)
GFR calc Af Amer: 55 mL/min/{1.73_m2} — ABNORMAL LOW (ref >59)
GFR calc non Af Amer: 47 mL/min/{1.73_m2} — ABNORMAL LOW (ref >59)
Glucose: 78 mg/dL (ref 65–99)
Potassium: 4.2 mmol/L (ref 3.5–5.2)
Sodium: 136 mmol/L (ref 134–144)

## 2020-07-14 LAB — LIPID PANEL
Chol/HDL Ratio: 6.9 ratio — ABNORMAL HIGH (ref 0.0–4.4)
Cholesterol, Total: 257 mg/dL — ABNORMAL HIGH (ref 100–199)
HDL: 37 mg/dL — ABNORMAL LOW (ref >39)
LDL Chol Calc (NIH): 188 mg/dL — ABNORMAL HIGH (ref 0–99)
Triglycerides: 172 mg/dL — ABNORMAL HIGH (ref 0–149)
VLDL Cholesterol Cal: 32 mg/dL (ref 5–40)

## 2020-07-14 LAB — HCV AB W REFLEX TO QUANT PCR: HCV Ab: <0.1 s/co ratio (ref 0.0–0.9)

## 2020-07-14 LAB — HCV INTERPRETATION

## 2020-07-14 NOTE — Assessment & Plan Note (Signed)
Patient does not currently have any symptoms and is not currently taking these medications.  No Rx needed at that time.

## 2020-08-18 ENCOUNTER — Other Ambulatory Visit: Payer: Self-pay | Admitting: Family Medicine

## 2020-08-18 ENCOUNTER — Encounter: Payer: Self-pay | Admitting: Family Medicine

## 2020-08-18 ENCOUNTER — Ambulatory Visit (INDEPENDENT_AMBULATORY_CARE_PROVIDER_SITE_OTHER): Payer: 59 | Admitting: Family Medicine

## 2020-08-18 ENCOUNTER — Other Ambulatory Visit: Payer: Self-pay

## 2020-08-18 VITALS — BP 120/80 | HR 74 | Wt 282.0 lb

## 2020-08-18 DIAGNOSIS — N1831 Chronic kidney disease, stage 3a: Secondary | ICD-10-CM | POA: Diagnosis not present

## 2020-08-18 DIAGNOSIS — I5032 Chronic diastolic (congestive) heart failure: Secondary | ICD-10-CM | POA: Diagnosis not present

## 2020-08-18 DIAGNOSIS — I10 Essential (primary) hypertension: Secondary | ICD-10-CM

## 2020-08-18 DIAGNOSIS — E782 Mixed hyperlipidemia: Secondary | ICD-10-CM

## 2020-08-18 DIAGNOSIS — N183 Chronic kidney disease, stage 3 unspecified: Secondary | ICD-10-CM | POA: Insufficient documentation

## 2020-08-18 MED ORDER — ATORVASTATIN CALCIUM 40 MG PO TABS: 40.0000 mg | ORAL_TABLET | Freq: Every day | ORAL | 3 refills | Status: DC

## 2020-08-18 MED FILL — ATORVASTATIN CALCIUM 40 MG: 40 | 90 days supply | Qty: 90 | Fill #0

## 2020-08-18 NOTE — Assessment & Plan Note (Signed)
BP now at goal.  We will continue with current regimen.  She has only been on these medications for about 2 weeks, therefore will wait to check a BMP.  She is planning to come back soon for a Pap smear, will perform BMP at that time.

## 2020-08-18 NOTE — Patient Instructions (Signed)
Thank you for coming to see me today. It was a pleasure. Today we talked about:   Call Dr. Jacinto Halim at [phone] to schedule an appointment.  We will start a cholesterol medicine.  Take it every day.  If you have leg cramps, please let me know.  Make an appointment for a pap smear.  Please follow-up with me in 3 months for blood pressure and cholesterol recheck.  If you have any questions or concerns, please do not hesitate to call the office at [phone].  Best,   Luis Abed, DO

## 2020-08-18 NOTE — Assessment & Plan Note (Signed)
Patient advised that she has CKD 3A.  She stated that she was aware of this.  Discussed that this is likely secondary to her hypertension, with improvement in control, this can help maintain her kidney function hopefully.

## 2020-08-18 NOTE — Assessment & Plan Note (Signed)
Stable.  Appears that patient's referral was placed to Dr. Verl Dicker office, patient given his phone number and advised to call to schedule follow-up appointment.  She would likely benefit from echo as her last echo was in 2013.  She is currently asymptomatic and does not appear fluid overloaded on examination.

## 2020-08-18 NOTE — Assessment & Plan Note (Signed)
Discussed with patient that given she is right at 190, and could consider her heart failure as cardiovascular disease, would recommend that she initiate a statin.  She agrees with this.  Also encouraged continued diet and lifestyle changes.  She declines birth control and reports that she is not sexually active and has no plans to be in the future.  She is aware that she should not become pregnant while taking a statin, and would ask for birth control prior to sexual intercourse.  We will start Lipitor 40 mg daily, plan to recheck LDL in 3 months.

## 2020-08-18 NOTE — Progress Notes (Signed)
    SUBJECTIVE:   CHIEF COMPLAINT / HPI:   HLD Lipid panel obtained on 07/13/2020 LDL 188, TGs 172, HDL 37 Has a history of CHF Denies chest pain, shortness of breath Not currently on Birth control, not sexually active Eats a lot of fried food, and has been trying to take it out Still exercising, walking and trying to jump rope Feels comfortable that she will not get pregnant and does not want contraception  HFpEF Refer to cardiology at last visit Did not hear anything Previously follow with Dr. Jacinto Halim  CKD 3a Creatinine stable On last check on 07/13/2020 Baseline appears to be around 1.4-1.6 Likely secondary to uncontrolled hypertension  HTN Current regimen: Spironolactone 50 mg daily, Coreg 25 mg 1.5 tabs twice daily, losartan-hydrochlorothiazide 50-12.5 mg daily, Norvasc 10 mg daily Still has about 2 weeks left of her medications, therefore started taking 2 weeks ago Patient reports compliance with her medications Denies recent chest pain or shortness of breath  PERTINENT  PMH / PSH: HTN, HFpEF, obesity  OBJECTIVE:   BP 120/80   Pulse 74   Wt 282 lb (127.9 kg)   SpO2 98%   BMI 49.95 kg/m    Physical Exam:  General: 34 y.o. female in NAD Cardio: RRR no m/r/g Lungs: CTAB, no wheezing, no rhonchi, no crackles, no IWOB on RA Skin: warm and dry Extremities: No edema   ASSESSMENT/PLAN:   Mixed hyperlipidemia Discussed with patient that given she is right at 190, and could consider her heart failure as cardiovascular disease, would recommend that she initiate a statin.  She agrees with this.  Also encouraged continued diet and lifestyle changes.  She declines birth control and reports that she is not sexually active and has no plans to be in the future.  She is aware that she should not become pregnant while taking a statin, and would ask for birth control prior to sexual intercourse.  We will start Lipitor 40 mg daily, plan to recheck LDL in 3 months.  Essential  hypertension BP now at goal.  We will continue with current regimen.  She has only been on these medications for about 2 weeks, therefore will wait to check a BMP.  She is planning to come back soon for a Pap smear, will perform BMP at that time.  CKD (chronic kidney disease) stage 3, GFR 30-59 ml/min Patient advised that she has CKD 3A.  She stated that she was aware of this.  Discussed that this is likely secondary to her hypertension, with improvement in control, this can help maintain her kidney function hopefully.  Chronic diastolic congestive heart failure (HCC) Stable.  Appears that patient's referral was placed to Dr. Verl Dicker office, patient given his phone number and advised to call to schedule follow-up appointment.  She would likely benefit from echo as her last echo was in 2013.  She is currently asymptomatic and does not appear fluid overloaded on examination.   Patient will return for Pap smear.  We will follow-up for hypertension and hyperlipidemia in 3 months.  Unknown Jim, DO [facility]

## 2020-09-01 ENCOUNTER — Other Ambulatory Visit: Payer: Self-pay | Admitting: Family Medicine

## 2020-09-01 DIAGNOSIS — I5032 Chronic diastolic (congestive) heart failure: Secondary | ICD-10-CM

## 2020-09-01 MED FILL — AMLODIPINE BESYLATE 10 MG T: 10 | 30 days supply | Qty: 30 | Fill #0

## 2020-09-01 MED FILL — CARVEDILOL 25 MG TABLET: 25 | 30 days supply | Qty: 90 | Fill #0

## 2020-09-01 MED FILL — SPIRONOLACTONE 50 MG TABLET: 50 | 30 days supply | Qty: 30 | Fill #0

## 2020-09-01 MED FILL — LOSARTAN-HCTZ 50-12.5 MG TA: 50-12.5 | 30 days supply | Qty: 30 | Fill #0

## 2020-09-06 ENCOUNTER — Encounter: Payer: Self-pay | Admitting: Family Medicine

## 2020-09-06 ENCOUNTER — Other Ambulatory Visit: Payer: Self-pay

## 2020-09-06 ENCOUNTER — Other Ambulatory Visit: Payer: Self-pay | Admitting: Family Medicine

## 2020-09-06 ENCOUNTER — Other Ambulatory Visit (HOSPITAL_COMMUNITY)
Admission: RE | Admit: 2020-09-06 | Discharge: 2020-09-06 | Disposition: A | Discharge status: Home / Self Care | Payer: 59 | Source: Ambulatory Visit | Attending: Family Medicine | Admitting: Family Medicine

## 2020-09-06 ENCOUNTER — Ambulatory Visit (INDEPENDENT_AMBULATORY_CARE_PROVIDER_SITE_OTHER): Payer: 59 | Admitting: Family Medicine

## 2020-09-06 VITALS — BP 122/88 | HR 74 | Wt 283.2 lb

## 2020-09-06 DIAGNOSIS — Z113 Encounter for screening for infections with a predominantly sexual mode of transmission: Secondary | ICD-10-CM | POA: Diagnosis not present

## 2020-09-06 DIAGNOSIS — Z124 Encounter for screening for malignant neoplasm of cervix: Secondary | ICD-10-CM | POA: Insufficient documentation

## 2020-09-06 NOTE — Patient Instructions (Signed)
Thank you for coming to see me today. It was a pleasure. Today we talked about:   Pap smear: It will take a few days to get the results of your tests.  I will message you on mychart or call if something needs to be done.  Please follow-up with me in November.  If you have any questions or concerns, please do not hesitate to call the office at [phone].  Best,   Luis Abed, DO

## 2020-09-06 NOTE — Assessment & Plan Note (Signed)
Pap smear performed today with HPV co-testing.  Also added on gonorrhea/chlamydia at patient request.  Patient advised that if Pap smear and HPV are negative, will be good for 5 years.  Will contact patient with results.

## 2020-09-06 NOTE — Progress Notes (Signed)
    SUBJECTIVE:   CHIEF COMPLAINT / HPI:   Pap Smear Last Pap smear 2015 with negative result.  Prior paps: not abnormal LMP 8/12 Contraception: none  Sexually Active: not sexually active  Desire for STD Screening: Yes, declines throat and anal, wants HIV and RPR  Self breast exams: Yes  Concerns: none    PERTINENT  PMH / PSH: HTN, HLD, HFpEF, Obesity  OBJECTIVE:   BP 122/88   Pulse 74   Wt 283 lb 3.2 oz (128.5 kg)   SpO2 97%   BMI 50.17 kg/m    Physical Exam:  General: 34 y.o. female in NAD Lungs: Breathing comfortably on RA Skin: warm and dry Extremities: No edema GU: Pelvic exam performed with patient supine.  Chaperone in room.  Bilateral labia without abnormalities.  Cervix exhibits red/brown discharge, no cervix abnormalities.  No vaginal lesions.  Vaginal discharge scant.  ASSESSMENT/PLAN:   Screening for cervical cancer Pap smear performed today with HPV co-testing.  Also added on gonorrhea/chlamydia at patient request.  Patient advised that if Pap smear and HPV are negative, will be good for 5 years.  Will contact patient with results.  Screen for sexually transmitted diseases G/C added onto Pap smear.  HIV and RPR performed at patient request.  Throat and anal swabs declined by patient.   Return in November for follow-up of other chronic health issues.  Unknown Jim, DO [facility]

## 2020-09-06 NOTE — Assessment & Plan Note (Signed)
G/C added onto Pap smear.  HIV and RPR performed at patient request.  Throat and anal swabs declined by patient.

## 2020-09-07 LAB — RPR: RPR Ser Ql: NONREACTIVE

## 2020-09-07 LAB — CYTOLOGY - PAP
Chlamydia: NEGATIVE
Comment: NEGATIVE
Comment: NEGATIVE
Comment: NORMAL
Diagnosis: NEGATIVE
High risk HPV: NEGATIVE
Neisseria Gonorrhea: NEGATIVE

## 2020-09-07 LAB — HIV ANTIBODY (ROUTINE TESTING W REFLEX): HIV Screen 4th Generation wRfx: NONREACTIVE

## 2020-10-11 NOTE — Progress Notes (Signed)
    SUBJECTIVE:   CHIEF COMPLAINT / HPI:   Hyperlipidemia Patient started on Lipitor 40 mg daily in August, LDL at that time 188 Has been compliant Denies chest pain, shortness of breath, leg cramps  Hypertension Current regimen: Spironolactone 50 mg daily, Coreg 25 mg 1.5 tabs twice daily, losartan-hydrochlorothiazide 50-12.5 mg daily, Norvasc 10 mg daily No BPs at home Last BMP in July She is very stressed and had 2 recent deaths in her family, including her brother passing suddenly last week  Chronic HFpEF Referred to Dr. Jacinto Halim previously She is supposed to call back again this week   PERTINENT  PMH / PSH: Chronic HFpEF, HTN, CKD 3, morbid obesity, HLD  OBJECTIVE:   BP (!) 152/84   Pulse 72   Ht 5\' 3"  (1.6 m)   Wt 282 lb (127.9 kg)   LMP 10/09/2020 (Exact Date)   SpO2 96%   BMI 49.95 kg/m    Physical Exam:  General: 34 y.o. female in NAD Cardio: RRR no m/r/g Lungs: CTAB, no wheezing, no rhonchi, no crackles, no IWOB on RA Skin: warm and dry Extremities: No edema   ASSESSMENT/PLAN:   Essential hypertension Blood pressure is above goal today, but given patient's recent stress, would not make changes in the acute setting.  Her last 2 visits she has been at goal.  For now, we will continue with her current regimen.  Will obtain a BMP in 4 weeks while also obtaining an LDL.  She will return in 6 weeks and can further assess at that time.  Mixed hyperlipidemia Stable, currently on Lipitor 40 mg daily.  LDL last checked in August, therefore will need to recheck in November.  Given this, will have patient return in 4 weeks for lab draw for direct LDL and will also obtain BMP at that time.  Can continue on Lipitor.  Chronic diastolic congestive heart failure (HCC) She is planning on calling Dr. December office later this week and scheduling an appointment.  For now, she does not appear to be fluid overloaded.  Doing well.     Verl Dicker, DO [facility]  [facility]

## 2020-10-12 ENCOUNTER — Other Ambulatory Visit: Payer: Self-pay

## 2020-10-12 ENCOUNTER — Ambulatory Visit (INDEPENDENT_AMBULATORY_CARE_PROVIDER_SITE_OTHER): Payer: 59 | Admitting: Family Medicine

## 2020-10-12 ENCOUNTER — Encounter: Payer: Self-pay | Admitting: Family Medicine

## 2020-10-12 VITALS — BP 152/84 | HR 72 | Ht 63.0 in | Wt 282.0 lb

## 2020-10-12 DIAGNOSIS — I10 Essential (primary) hypertension: Secondary | ICD-10-CM

## 2020-10-12 DIAGNOSIS — E782 Mixed hyperlipidemia: Secondary | ICD-10-CM

## 2020-10-12 DIAGNOSIS — I5032 Chronic diastolic (congestive) heart failure: Secondary | ICD-10-CM

## 2020-10-12 NOTE — Assessment & Plan Note (Signed)
Stable, currently on Lipitor 40 mg daily.  LDL last checked in August, therefore will need to recheck in November.  Given this, will have patient return in 4 weeks for lab draw for direct LDL and will also obtain BMP at that time.  Can continue on Lipitor.

## 2020-10-12 NOTE — Assessment & Plan Note (Signed)
She is planning on calling Dr. Verl Dicker office later this week and scheduling an appointment.  For now, she does not appear to be fluid overloaded.  Doing well.

## 2020-10-12 NOTE — Patient Instructions (Signed)
Thank you for coming to see me today. It was a pleasure. Today we talked about:   If you need anything please let me know.  Come back in 4 weeks for lab work.  Please follow-up with me in 6 weeks for an appointment.  If you have any questions or concerns, please do not hesitate to call the office at [phone].  Best,   Luis Abed, DO

## 2020-10-12 NOTE — Assessment & Plan Note (Signed)
Blood pressure is above goal today, but given patient's recent stress, would not make changes in the acute setting.  Her last 2 visits she has been at goal.  For now, we will continue with her current regimen.  Will obtain a BMP in 4 weeks while also obtaining an LDL.  She will return in 6 weeks and can further assess at that time.

## 2020-10-26 ENCOUNTER — Other Ambulatory Visit: Payer: Self-pay | Admitting: Family Medicine

## 2020-10-26 DIAGNOSIS — I5032 Chronic diastolic (congestive) heart failure: Secondary | ICD-10-CM

## 2020-10-26 MED FILL — LOSARTAN-HCTZ 50-12.5 MG TA: 50-12.5 | 30 days supply | Qty: 30 | Fill #0

## 2020-10-26 MED FILL — AMLODIPINE BESYLATE 10 MG T: 10 | 30 days supply | Qty: 30 | Fill #0

## 2020-10-26 MED FILL — SPIRONOLACTONE 50 MG TABLET: 50 | 30 days supply | Qty: 30 | Fill #0

## 2020-10-26 MED FILL — CARVEDILOL 25 MG TABLET: 25 | 30 days supply | Qty: 90 | Fill #0

## 2020-11-03 ENCOUNTER — Encounter: Payer: Self-pay | Admitting: Family Medicine

## 2020-11-09 ENCOUNTER — Other Ambulatory Visit: Payer: Self-pay

## 2020-11-09 ENCOUNTER — Other Ambulatory Visit: Payer: 59

## 2020-11-09 DIAGNOSIS — E782 Mixed hyperlipidemia: Secondary | ICD-10-CM

## 2020-11-09 DIAGNOSIS — I10 Essential (primary) hypertension: Secondary | ICD-10-CM

## 2020-11-10 LAB — BASIC METABOLIC PANEL
BUN/Creatinine Ratio: 9 (ref 9–23)
BUN: 12 mg/dL (ref 6–20)
CO2: 26 mmol/L (ref 20–29)
Calcium: 9.9 mg/dL (ref 8.7–10.2)
Chloride: 99 mmol/L (ref 96–106)
Creatinine, Ser: 1.35 mg/dL — ABNORMAL HIGH (ref 0.57–1.00)
GFR calc Af Amer: 60 mL/min/{1.73_m2} (ref >59)
GFR calc non Af Amer: 52 mL/min/{1.73_m2} — ABNORMAL LOW (ref >59)
Glucose: 105 mg/dL — ABNORMAL HIGH (ref 65–99)
Potassium: 4.1 mmol/L (ref 3.5–5.2)
Sodium: 138 mmol/L (ref 134–144)

## 2020-11-10 LAB — LDL CHOLESTEROL, DIRECT: LDL Direct: 75 mg/dL (ref 0–99)

## 2020-11-23 ENCOUNTER — Ambulatory Visit: Payer: 59 | Admitting: Family Medicine

## 2020-11-27 NOTE — Progress Notes (Signed)
° ° °  SUBJECTIVE:   CHIEF COMPLAINT / HPI:   HTN f/u Current regimen: Spironolcatone 50mg  QD, Coreg 25mg  1.5 tabs BID, Losartan-HCTZ 50-12.5mg  QD, Norvasc 10mg  QD Had BMP 11/10 BPs at home 140-160 SBP at home, DBP in 80s Stress at home has been improving, overall doing okay No CP, SOB, leg swelling  HLD Currently on Lipitor 40mg  QD, started in August LDL 188 at that time LDL recheck 75 on 11/10  Patient has yet been vaccinated for Covid line she reports that she has a knot wanting to do so at this time, but not say that she will never do it She continues to think about it  PERTINENT  PMH / PSH: HFpEF, HTN, CKD 3, morbid obesity, HLD  OBJECTIVE:   BP (!) 150/90    Pulse 76    Ht 5\' 3"  (1.6 m)    Wt 286 lb 6 oz (129.9 kg)    LMP 11/21/2020    SpO2 99%    BMI 50.73 kg/m    Physical Exam:  General: 34 y.o. female in NAD Cardio: RRR no m/r/g Lungs: CTAB, no wheezing, no rhonchi, no crackles, no IWOB on RA Skin: warm and dry Extremities: No edema   ASSESSMENT/PLAN:   Essential hypertension BP remains above goal.  We will go ahead and increase her blood pressure medication.  We will increase her losartan-HCTZ to 100-12.5 mg daily.  Could also consider increasing HCTZ component if needed in the future.  We will obtain a BMP at her next visit in 1 month.  Last BMP was on 11/10 and stable.  She has an appointment with cardiology next week.  Mixed hyperlipidemia LDL 75 on 11/10.  Continue Lipitor 40 mg daily.   Discussed Covid vaccination.  Patient did not have any questions at this time.  Advised follow-up or send MyChart message if she has any questions.  , DO [facility]

## 2020-11-28 ENCOUNTER — Ambulatory Visit (INDEPENDENT_AMBULATORY_CARE_PROVIDER_SITE_OTHER): Payer: 59 | Admitting: Family Medicine

## 2020-11-28 ENCOUNTER — Other Ambulatory Visit: Payer: Self-pay | Admitting: Family Medicine

## 2020-11-28 ENCOUNTER — Encounter: Payer: Self-pay | Admitting: Family Medicine

## 2020-11-28 ENCOUNTER — Other Ambulatory Visit: Payer: Self-pay

## 2020-11-28 VITALS — BP 150/90 | HR 76 | Ht 63.0 in | Wt 286.4 lb

## 2020-11-28 DIAGNOSIS — I10 Essential (primary) hypertension: Secondary | ICD-10-CM

## 2020-11-28 DIAGNOSIS — E782 Mixed hyperlipidemia: Secondary | ICD-10-CM | POA: Diagnosis not present

## 2020-11-28 MED ORDER — LOSARTAN POTASSIUM-HCTZ 100-25 MG PO TABS: 1.0000 | ORAL_TABLET | Freq: Every day | ORAL | 1 refills | Status: DC

## 2020-11-28 MED FILL — LOSARTAN-HCTZ 100-25 MG TAB: 100-25 | 90 days supply | Qty: 90 | Fill #0

## 2020-11-28 NOTE — Assessment & Plan Note (Signed)
LDL 75 on 11/10.  Continue Lipitor 40 mg daily.

## 2020-11-28 NOTE — Assessment & Plan Note (Signed)
BP remains above goal.  We will go ahead and increase her blood pressure medication.  We will increase her losartan-HCTZ to 100-12.5 mg daily.  Could also consider increasing HCTZ component if needed in the future.  We will obtain a BMP at her next visit in 1 month.  Last BMP was on 11/10 and stable.  She has an appointment with cardiology next week.

## 2020-11-28 NOTE — Patient Instructions (Signed)
Thank you for coming to see me today. It was a pleasure. Today we talked about:   We will increase your blood pressure medications to losartan-hydrochlorothiazide 100mg -12.5mg  daily.  We will check your labs next time.  Please follow-up with me in 1 month.  If you have any questions or concerns, please do not hesitate to call the office at [phone].  Best,   [phone], DO

## 2020-11-29 ENCOUNTER — Other Ambulatory Visit: Payer: Self-pay | Admitting: Family Medicine

## 2020-11-29 DIAGNOSIS — I5032 Chronic diastolic (congestive) heart failure: Secondary | ICD-10-CM

## 2020-11-29 MED FILL — SPIRONOLACTONE 50 MG TABLET: 50 | 90 days supply | Qty: 90 | Fill #0

## 2020-11-29 MED FILL — AMLODIPINE BESYLATE 10 MG T: 10 | 90 days supply | Qty: 90 | Fill #0

## 2020-12-09 ENCOUNTER — Encounter: Payer: Self-pay | Admitting: Cardiology

## 2020-12-09 ENCOUNTER — Ambulatory Visit: Payer: 59 | Admitting: Cardiology

## 2020-12-09 ENCOUNTER — Other Ambulatory Visit: Payer: Self-pay | Admitting: Cardiology

## 2020-12-09 ENCOUNTER — Other Ambulatory Visit: Payer: Self-pay

## 2020-12-09 VITALS — BP 140/75 | HR 77 | Resp 16 | Ht 63.0 in | Wt 283.6 lb

## 2020-12-09 DIAGNOSIS — R9431 Abnormal electrocardiogram [ECG] [EKG]: Secondary | ICD-10-CM

## 2020-12-09 DIAGNOSIS — Z6841 Body Mass Index (BMI) 40.0 and over, adult: Secondary | ICD-10-CM

## 2020-12-09 DIAGNOSIS — I10 Essential (primary) hypertension: Secondary | ICD-10-CM

## 2020-12-09 DIAGNOSIS — I5042 Chronic combined systolic (congestive) and diastolic (congestive) heart failure: Secondary | ICD-10-CM

## 2020-12-09 DIAGNOSIS — Z87891 Personal history of nicotine dependence: Secondary | ICD-10-CM

## 2020-12-09 DIAGNOSIS — E782 Mixed hyperlipidemia: Secondary | ICD-10-CM

## 2020-12-09 MED ORDER — ENTRESTO 49-51 MG PO TABS: 1.0000 | ORAL_TABLET | Freq: Two times a day (BID) | ORAL | 0 refills | Status: DC

## 2020-12-09 MED FILL — ENTRESTO 49 MG-51 MG TABLET: 49-51 | 30 days supply | Qty: 60 | Fill #0

## 2020-12-09 NOTE — Progress Notes (Signed)
Date:  12/09/2020   ID:  [name], DOB 08/01/1986, MRN [id number]  PCP:  Unknown Jim, DO  Cardiologist:  Tessa Lerner, DO, [facility] (established care 12/09/2020) Former Cardiology Providers: Dr. Yates Decamp  REASON FOR CONSULT: Chronic heart failure  REQUESTING PHYSICIAN:  Unknown Jim, DO [address] [place],  [place] [address]  Chief Complaint  Patient presents with  . Congestive Heart Failure  . New Patient (Initial Visit)    HPI  Robin May is a 34 y.o. female who presents to the office with a chief complaint of " congestive heart failure management." Patient's past medical history and cardiovascular risk factors include: Hypertension, obesity due to excess calories (BMI 50), former smoker, chronic systolic and diastolic heart failure.  She is referred to the office at the request of Meccariello, Solmon Ice, * for evaluation of congestive heart failure.  Patient presents to the office to reestablish care as she was formally seeing Dr. Jacinto Halim for management of congestive heart failure.  She was hospitalized for management of congestive heart failure and hypertensive urgency in the past.  She was lost to follow-up due to losing her insurance.  In the recent past patient denies any chest pain or anginal equivalent.  No acute exacerbation of congestive heart failure.  No recent hospitalizations or urgent care visits for cardiovascular symptoms.  Patient states that she is currently taking her medications as prescribed.  FUNCTIONAL STATUS: Walks on the track about 6 times on the weekend.     ALLERGIES: No Known Allergies  MEDICATION LIST PRIOR TO VISIT: Current Meds  Medication Sig  . amLODipine (NORVASC) 10 MG tablet TAKE 1 TABLET BY MOUTH ONCE DAILY  . atorvastatin (LIPITOR) 40 MG tablet Take 1 tablet (40 mg total) by mouth daily.  . carvedilol (COREG) 25 MG tablet TAKE 1 AND 1/2 TABLETS BY MOUTH TWICE DAILY WITH MEALS  . sodium chloride (OCEAN)  0.65 % SOLN nasal spray Place 1 spray into both nostrils as needed for congestion.  Marland Kitchen spironolactone (ALDACTONE) 50 MG tablet TAKE 1 TABLET BY MOUTH ONCE DAILY  . [DISCONTINUED] losartan-hydrochlorothiazide (HYZAAR) 100-25 MG tablet Take 1 tablet by mouth daily.     PAST MEDICAL HISTORY: Past Medical History:  Diagnosis Date  . Allergy   . Anginal pain (HCC) 08/18/2012   "mild"  . CHF (congestive heart failure) (HCC)   . Heart failure (HCC) 2016  . Hyperlipidemia   . Hypertension   . Shortness of breath 08/19/2012   "w/exertion and w/lying down"    PAST SURGICAL HISTORY: Past Surgical History:  Procedure Laterality Date  . NO PAST SURGERIES      FAMILY HISTORY: The patient family history includes Asthma in her brother and sister; Diabetes in her father; Heart disease in her maternal grandfather; Hypertension in her maternal grandfather, maternal grandmother, mother, paternal grandfather, and paternal grandmother; Kidney failure in her maternal grandfather; Thyroid disease in her mother.  SOCIAL HISTORY:  The patient  reports that she quit smoking about 14 years ago. Her smoking use included cigars. She has never used smokeless tobacco. She reports previous alcohol use of about 1.0 - 2.0 standard drink of alcohol per week. She reports that she does not use drugs.  REVIEW OF SYSTEMS: Review of Systems  Constitutional: Negative for chills and fever.  HENT: Negative for hoarse voice and nosebleeds.   Eyes: Negative for discharge, double vision and pain.  Cardiovascular: Negative for chest pain, claudication, dyspnea on exertion, leg swelling,  near-syncope, orthopnea, palpitations, paroxysmal nocturnal dyspnea and syncope.  Respiratory: Negative for hemoptysis and shortness of breath.   Musculoskeletal: Negative for muscle cramps and myalgias.  Gastrointestinal: Negative for abdominal pain, constipation, diarrhea, hematemesis, hematochezia, melena, nausea and vomiting.  Neurological:  Negative for dizziness and light-headedness.    PHYSICAL EXAM: Vitals with BMI 12/09/2020 11/28/2020 10/12/2020  Height 5\' 3"  5\' 3"  5\' 3"   Weight 283 lbs 10 oz 286 lbs 6 oz 282 lbs  BMI 50.25 50.74 49.97  Systolic 140 150  Diastolic 75 90 84  Pulse 77 76 72   CONSTITUTIONAL: Well-developed and well-nourished. No acute distress.  SKIN: Skin is warm and dry. No rash noted. No cyanosis. No pallor. No jaundice HEAD: Normocephalic and atraumatic.  EYES: No scleral icterus MOUTH/THROAT: Moist oral membranes.  NECK: No JVD present. No thyromegaly noted. No carotid bruits  LYMPHATIC: No visible cervical adenopathy.  CHEST Normal respiratory effort. No intercostal retractions  LUNGS: Clear to auscultation bilaterally.  No stridor. No wheezes. No rales.  CARDIOVASCULAR: Regular rate and rhythm, positive S1-S2, no murmurs rubs or gallops appreciated. ABDOMINAL: Obese, soft, nontender, nondistended, positive bowel sounds in all 4 quadrants, no apparent ascites.  EXTREMITIES: No peripheral edema, warm to touch, 2+ dorsalis pedis and posterior tibial pulses. HEMATOLOGIC: No significant bruising NEUROLOGIC: Oriented to person, place, and time. Nonfocal. Normal muscle tone.  PSYCHIATRIC: Normal mood and affect. Normal behavior. Cooperative  CARDIAC DATABASE: EKG: 12/09/2020: Normal sinus rhythm, 81 bpm, T wave inversions in the high lateral leads possible ischemia, poor R wave progression, without underlying injury pattern.  Echocardiogram: 08/20/2012: LVEF 45-50%, diffuse hypokinesis, grade 3 diastolic dysfunction mild AR, mildly dilated left atrium, RVSP 31 mmHg.   Stress Testing: No results found for this or any previous visit from the past 1095 days.  Heart Catheterization: None  LABORATORY DATA: CBC Latest Ref Rng & Units 09/22/2014 08/19/2012 08/19/2012  WBC 4.0 - 10.5 K/uL 6.0 8.7 8.3  Hemoglobin 12.0 - 15.0 g/dL 09/24/2014 08/21/2012 08/21/2012  Hematocrit 36.0 - 46.0 % 43.7 41.2 46.6  Platelets  150 - 400 K/uL 353 335 -    CMP Latest Ref Rng & Units 11/09/2020 07/13/2020 05/26/2020  Glucose 65 - 99 mg/dL 13/09/2020) 78 07/15/2020)  BUN 6 - 20 mg/dL 12 19 16   Creatinine 0.57 - 1.00 mg/dL 05/28/2020) 263(Z) 858(I)  Sodium 134 - 144 mmol/L 138 136 137  Potassium 3.5 - 5.2 mmol/L 4.1 4.2 3.4(L)  Chloride 96 - 106 mmol/L 99 100 103  CO2 20 - 29 mmol/L 26 20 25   Calcium 8.7 - 10.2 mg/dL 9.9 9.8 9.1  Total Protein 6.5 - 8.1 g/dL - - 7.0  Total Bilirubin 0.3 - 1.2 mg/dL - - 0.7  Alkaline Phos 38 - 126 U/L - - 36(L)  AST 15 - 41 U/L - - 18  ALT 0 - 44 U/L - - 23    Lipid Panel  Lab Results  Component Value Date   CHOL 257 (H) 07/13/2020   HDL 37 (L) 07/13/2020   LDLCALC 188 (H) 07/13/2020   LDLDIRECT 75 11/09/2020   TRIG 172 (H) 07/13/2020   CHOLHDL 6.9 (H) 07/13/2020   No components found for: NTPROBNP No results for input(s): PROBNP in the last 8760 hours. No results for input(s): TSH in the last 8760 hours.  BMPd Recent Labs    05/26/20 0842 07/13/20 1834 11/09/20 0850  NA 137 136 138  K 3.4* 4.2 4.1  CL 103 100 99  CO2 25 20  26  GLUCOSE 138* 78 105*  BUN 16 19 12   CREATININE 1.59* 1.45* 1.35*  CALCIUM 9.1 9.8 9.9  GFRNONAA 43* 47* 52*  GFRAA 49* 55* 60    HEMOGLOBIN A1C Lab Results  Component Value Date   HGBA1C 5.5 08/19/2012   MPG 111 08/19/2012    IMPRESSION:    ICD-10-CM   1. Chronic combined systolic and diastolic congestive heart failure (HCC)  I50.42 EKG 12-Lead    PCV ECHOCARDIOGRAM COMPLETE    Basic metabolic panel    Pro b natriuretic peptide (BNP)    Magnesium    sacubitril-valsartan (ENTRESTO) 49-51 MG  2. Mixed hyperlipidemia  E78.2 Lipid Panel With LDL/HDL Ratio  3. Essential hypertension  I10   4. Former smoker  Z87.891   5. Class 3 severe obesity due to excess calories with serious comorbidity and body mass index (BMI) of 50.0 to 59.9 in adult (HCC)  E66.01    Z68.43   6. Abnormal electrocardiogram  R94.31 PCV MYOCARDIAL PERFUSION WO  LEXISCAN    SARS-COV-2 RNA,(COVID-19) QUAL NAAT     RECOMMENDATIONS: Robin May is a 34 y.o. female whose past medical history and cardiac risk factors include: Hypertension, obesity due to excess calories (BMI 50), former smoker, chronic systolic and diastolic heart failure.  Chronic systolic and diastolic heart failure, stage C, NYHA class II:  No recent hospitalizations for congestive heart failure.  Medications reconciled.  Recommend holding losartan/hydrochlorothiazide and transitioning her to Entresto 49/51 mg p.o. twice daily  Patient is asked to call the office if she is unable to afford Entresto and see if she would qualify for patient assistance otherwise the alternative would be to restart losartan/hydrochlorothiazide that she is currently on.  If she starts Entresto she is recommended to have a follow-up blood work in 1 week to evaluate kidney function and electrolytes.  We will increase her guideline directed medical therapy in a stepwise fashion during the next follow-up office visits.  Echocardiogram will be ordered to evaluate for structural heart disease and left ventricular systolic function.  Abnormal EKG:  Patient's underlying rhythm is a normal sinus but she does have T wave inversion in the high lateral leads.  In addition given her cardiovascular risk factors of such a young age would recommend an ischemic evaluation.  Patient does not vaccinated for COVID-19 and therefore will require a Covid screen prior to exercise nuclear stress test.  Mixed hyperlipidemia:  Check fasting lipid profile.  Continue statin therapy.  Benign essential hypertension:  Office blood pressure is currently not at goal.  However, patient states that her home blood pressures are better managed.  Low-salt diet recommended.  Patient is asked to keep a log of her blood pressures and to bring it in at the next office visit.  Currently managed by primary care  provider.  Obesity, due to excess calories: . Body mass index is 50.24 kg/m. . I reviewed with the patient the importance of diet, regular physical activity/exercise, weight loss.   . Patient is educated on increasing physical activity gradually as tolerated.  With the goal of moderate intensity exercise for 30 minutes a day 5 days a week.  FINAL MEDICATION LIST END OF ENCOUNTER: Meds ordered this encounter  Medications  . sacubitril-valsartan (ENTRESTO) 49-51 MG    Sig: Take 1 tablet by mouth 2 (two) times daily.    Dispense:  60 tablet    Refill:  0    Medications Discontinued During This Encounter  Medication Reason  .  losartan-hydrochlorothiazide (HYZAAR) 100-25 MG tablet Change in therapy     Current Outpatient Medications:  .  amLODipine (NORVASC) 10 MG tablet, TAKE 1 TABLET BY MOUTH ONCE DAILY, Disp: 90 tablet, Rfl: 2 .  atorvastatin (LIPITOR) 40 MG tablet, Take 1 tablet (40 mg total) by mouth daily., Disp: 90 tablet, Rfl: 3 .  carvedilol (COREG) 25 MG tablet, TAKE 1 AND 1/2 TABLETS BY MOUTH TWICE DAILY WITH MEALS, Disp: 90 tablet, Rfl: 0 .  sodium chloride (OCEAN) 0.65 % SOLN nasal spray, Place 1 spray into both nostrils as needed for congestion., Disp: 44 mL, Rfl: 0 .  spironolactone (ALDACTONE) 50 MG tablet, TAKE 1 TABLET BY MOUTH ONCE DAILY, Disp: 90 tablet, Rfl: 2 .  sacubitril-valsartan (ENTRESTO) 49-51 MG, Take 1 tablet by mouth 2 (two) times daily., Disp: 60 tablet, Rfl: 0  Orders Placed This Encounter  Procedures  . SARS-COV-2 RNA,(COVID-19) QUAL NAAT  . Basic metabolic panel  . Pro b natriuretic peptide (BNP)  . Magnesium  . Lipid Panel With LDL/HDL Ratio  . PCV MYOCARDIAL PERFUSION WO LEXISCAN  . EKG 12-Lead  . PCV ECHOCARDIOGRAM COMPLETE    There are no Patient Instructions on file for this visit.   --Continue cardiac medications as reconciled in final medication list. --Return in about 2 weeks (around 12/23/2020) for heart failure management.. Or sooner  if needed. --Continue follow-up with your primary care physician regarding the management of your other chronic comorbid conditions.  Patient's questions and concerns were addressed to her satisfaction. She voices understanding of the instructions provided during this encounter.   This note was created using a voice recognition software as a result there may be grammatical errors inadvertently enclosed that do not reflect the nature of this encounter. Every attempt is made to correct such errors.  Tessa LernerSunit Zemirah Krasinski, [place]DO, [facility] SystemFACC  Pager: [phone] Office: [phone]

## 2020-12-12 ENCOUNTER — Other Ambulatory Visit: Payer: Self-pay | Admitting: Cardiology

## 2020-12-13 LAB — BASIC METABOLIC PANEL
BUN/Creatinine Ratio: 11 (ref 9–23)
BUN: 15 mg/dL (ref 6–20)
CO2: 23 mmol/L (ref 20–29)
Calcium: 9.7 mg/dL (ref 8.7–10.2)
Chloride: 101 mmol/L (ref 96–106)
Creatinine, Ser: 1.31 mg/dL — ABNORMAL HIGH (ref 0.57–1.00)
GFR calc Af Amer: 62 mL/min/{1.73_m2} (ref >59)
GFR calc non Af Amer: 54 mL/min/{1.73_m2} — ABNORMAL LOW (ref >59)
Glucose: 107 mg/dL — ABNORMAL HIGH (ref 65–99)
Potassium: 4.7 mmol/L (ref 3.5–5.2)
Sodium: 137 mmol/L (ref 134–144)

## 2020-12-13 LAB — LIPID PANEL WITH LDL/HDL RATIO
Cholesterol, Total: 141 mg/dL (ref 100–199)
HDL: 30 mg/dL — ABNORMAL LOW (ref >39)
LDL Chol Calc (NIH): 86 mg/dL (ref 0–99)
LDL/HDL Ratio: 2.9 ratio (ref 0.0–3.2)
Triglycerides: 140 mg/dL (ref 0–149)
VLDL Cholesterol Cal: 25 mg/dL (ref 5–40)

## 2020-12-13 LAB — MAGNESIUM: Magnesium: 2 mg/dL (ref 1.6–2.3)

## 2020-12-13 LAB — PRO B NATRIURETIC PEPTIDE: NT-Pro BNP: 32 pg/mL (ref 0–130)

## 2020-12-14 ENCOUNTER — Other Ambulatory Visit: Payer: Self-pay

## 2020-12-14 ENCOUNTER — Ambulatory Visit: Payer: 59

## 2020-12-14 DIAGNOSIS — R9431 Abnormal electrocardiogram [ECG] [EKG]: Secondary | ICD-10-CM

## 2020-12-20 ENCOUNTER — Other Ambulatory Visit: Payer: Self-pay

## 2020-12-20 ENCOUNTER — Ambulatory Visit: Payer: 59

## 2020-12-20 DIAGNOSIS — I5042 Chronic combined systolic (congestive) and diastolic (congestive) heart failure: Secondary | ICD-10-CM

## 2021-01-02 ENCOUNTER — Ambulatory Visit: Payer: 59 | Admitting: Cardiology

## 2021-01-13 ENCOUNTER — Other Ambulatory Visit: Payer: Self-pay | Admitting: Cardiology

## 2021-01-13 DIAGNOSIS — I5042 Chronic combined systolic (congestive) and diastolic (congestive) heart failure: Secondary | ICD-10-CM

## 2021-01-13 MED FILL — ATORVASTATIN 40 MG TABLET: 40 | 90 days supply | Qty: 90 | Fill #1

## 2021-01-13 MED FILL — ENTRESTO 49 MG-51 MG TABLET: 49-51 | 30 days supply | Qty: 60 | Fill #0

## 2021-01-16 ENCOUNTER — Ambulatory Visit: Payer: 59 | Admitting: Cardiology

## 2021-01-19 ENCOUNTER — Ambulatory Visit: Payer: 59 | Admitting: Cardiology

## 2021-01-19 ENCOUNTER — Other Ambulatory Visit: Payer: Self-pay | Admitting: Family Medicine

## 2021-01-19 ENCOUNTER — Other Ambulatory Visit: Payer: Self-pay

## 2021-01-19 ENCOUNTER — Encounter: Payer: Self-pay | Admitting: Cardiology

## 2021-01-19 VITALS — BP 132/83 | HR 77 | Temp 97.9°F | Resp 16 | Ht 63.0 in | Wt 269.0 lb

## 2021-01-19 DIAGNOSIS — I5042 Chronic combined systolic (congestive) and diastolic (congestive) heart failure: Secondary | ICD-10-CM

## 2021-01-19 DIAGNOSIS — I5032 Chronic diastolic (congestive) heart failure: Secondary | ICD-10-CM

## 2021-01-19 DIAGNOSIS — I10 Essential (primary) hypertension: Secondary | ICD-10-CM

## 2021-01-19 DIAGNOSIS — Z87891 Personal history of nicotine dependence: Secondary | ICD-10-CM

## 2021-01-19 DIAGNOSIS — E782 Mixed hyperlipidemia: Secondary | ICD-10-CM

## 2021-01-19 MED FILL — CARVEDILOL 25 MG TABS: 25 | 30 days supply | Qty: 90 | Fill #0

## 2021-01-19 NOTE — Progress Notes (Signed)
Date:  01/19/2021   ID:  Robin May, DOB 1986-10-20, MRN [id number]  PCP:  Unknown Jim, DO  Cardiologist:  Tessa Lerner, DO, [facility] (established care 12/09/2020) Former Cardiology Providers: Dr. Yates Decamp  Date: 01/19/21 Last Office Visit: 12/09/2020  Chief Complaint  Patient presents with  . Chronic combined systolic and diastolic congestive heart fa  . Follow-up  . Results    HPI  Robin May is a 35 y.o. female who presents to the office with a chief complaint of " congestive heart failure management." Patient's past medical history and cardiovascular risk factors include: Hypertension, obesity due to excess calories (BMI 47), former smoker, chronic systolic and diastolic heart failure.  She is referred to the office at the request of Meccariello, Solmon Ice, * for evaluation of congestive heart failure.  Patient reestablished care back in December 2021 for congestive heart failure management after her insurance being reinstated.  Given her combined systolic and diastolic heart failure we decided to initiate Entresto at the last office visit.  Patient tolerated the medication well without any significant side effects or intolerances.  She forgot to have repeat blood work in 1 week after initiating Entresto.  She has initiated lifestyle changes by eating better, discontinuing the consumption of chips, walking 30 minutes on a treadmill twice a week.  As result she is lost approximately 19 pounds since last visit.  She is congratulated on her efforts.  Results of the echocardiogram and stress test reviewed.  The stress test was overall a low risk study.  Her echocardiogram noted low normal LVEF, grade 2 diastolic impairment and elevated left atrial pressure.  No recent hospitalizations or urgent care visits for cardiovascular symptoms.  She is overall euvolemic and not in congestive heart failure.  Independently reviewed labs from December 12, 2020.  FUNCTIONAL  STATUS: Walks on the track about 6 times on the weekend.     ALLERGIES: No Known Allergies  MEDICATION LIST PRIOR TO VISIT: Current Meds  Medication Sig  . amLODipine (NORVASC) 10 MG tablet TAKE 1 TABLET BY MOUTH ONCE DAILY  . atorvastatin (LIPITOR) 40 MG tablet Take 1 tablet (40 mg total) by mouth daily.  . carvedilol (COREG) 25 MG tablet TAKE 1 AND 1/2 TABLETS BY MOUTH TWICE DAILY WITH MEALS  . ENTRESTO 49-51 MG TAKE 1 TABLET BY MOUTH 2 TIMES DAILY  . sodium chloride (OCEAN) 0.65 % SOLN nasal spray Place 1 spray into both nostrils as needed for congestion.  Marland Kitchen spironolactone (ALDACTONE) 50 MG tablet TAKE 1 TABLET BY MOUTH ONCE DAILY     PAST MEDICAL HISTORY: Past Medical History:  Diagnosis Date  . Allergy   . Anginal pain (HCC) 08/18/2012   "mild"  . CHF (congestive heart failure) (HCC)   . Heart failure (HCC) 2016  . Hyperlipidemia   . Hypertension   . Shortness of breath 08/19/2012   "w/exertion and w/lying down"    PAST SURGICAL HISTORY: Past Surgical History:  Procedure Laterality Date  . NO PAST SURGERIES      FAMILY HISTORY: The patient family history includes Asthma in her brother and sister; Diabetes in her father; Heart disease in her maternal grandfather; Hypertension in her maternal grandfather, maternal grandmother, mother, paternal grandfather, and paternal grandmother; Kidney failure in her maternal grandfather; Thyroid disease in her mother.  SOCIAL HISTORY:  The patient  reports that she quit smoking about 15 years ago. Her smoking use included cigars. She has never used smokeless tobacco. She reports  previous alcohol use of about 1.0 - 2.0 standard drink of alcohol per week. She reports that she does not use drugs.  REVIEW OF SYSTEMS: Review of Systems  Constitutional: Negative for chills and fever.  HENT: Negative for hoarse voice and nosebleeds.   Eyes: Negative for discharge, double vision and pain.  Cardiovascular: Negative for chest pain,  claudication, dyspnea on exertion, leg swelling, near-syncope, orthopnea, palpitations, paroxysmal nocturnal dyspnea and syncope.  Respiratory: Negative for hemoptysis and shortness of breath.   Musculoskeletal: Negative for muscle cramps and myalgias.  Gastrointestinal: Negative for abdominal pain, constipation, diarrhea, hematemesis, hematochezia, melena, nausea and vomiting.  Neurological: Negative for dizziness and light-headedness.    PHYSICAL EXAM: Vitals with BMI 01/19/2021 12/09/2020 11/28/2020  Height 5\' 3"  5\' 3"  5\' 3"   Weight 269 lbs 283 lbs 10 oz 286 lbs 6 oz  BMI 47.66 50.25 50.74  Systolic 132 140 [id number]  Diastolic 83 75 90  Pulse 77 77 76   CONSTITUTIONAL: Well-developed and well-nourished. No acute distress.  SKIN: Skin is warm and dry. No rash noted. No cyanosis. No pallor. No jaundice HEAD: Normocephalic and atraumatic.  EYES: No scleral icterus MOUTH/THROAT: Moist oral membranes.  NECK: No JVD present. No thyromegaly noted. No carotid bruits  LYMPHATIC: No visible cervical adenopathy.  CHEST Normal respiratory effort. No intercostal retractions  LUNGS: Clear to auscultation bilaterally.  No stridor. No wheezes. No rales.  CARDIOVASCULAR: Regular rate and rhythm, positive S1-S2, no murmurs rubs or gallops appreciated. ABDOMINAL: Obese, soft, nontender, nondistended, positive bowel sounds in all 4 quadrants, no apparent ascites.  EXTREMITIES: No peripheral edema, warm to touch, 2+ dorsalis pedis and posterior tibial pulses. HEMATOLOGIC: No significant bruising NEUROLOGIC: Oriented to person, place, and time. Nonfocal. Normal muscle tone.  PSYCHIATRIC: Normal mood and affect. Normal behavior. Cooperative  CARDIAC DATABASE: EKG: 12/09/2020: Normal sinus rhythm, 81 bpm, T wave inversions in the high lateral leads possible ischemia, poor R wave progression, without underlying injury pattern.  Echocardiogram: 08/20/2012: LVEF 45-50%, diffuse hypokinesis, grade 3 diastolic  dysfunction mild AR, mildly dilated left atrium, RVSP 31 mmHg.  12/20/2020:  Low normal LV systolic function with EF 53%. Left ventricle cavity is normal in size. Moderate concentric remodeling of the left ventricle. Normal global wall motion. Doppler evidence of grade II (pseudonormal) diastolic dysfunction, elevated LAP. Calculated EF 53%. Trileaflet aortic valve. Mild (Grade I) aortic regurgitation. No significant change from the report of 08/20/2012.   Stress Testing: Lexiscan Sestamibi stress test 12/14/2020: Stress EKG is non-diagnostic, as this is pharmacological stress test using Lexiscan. Patient was scheduled for Exercise nuclear stress test; however, she did not reach 85% APMHR so transitioned to [place].  No apparent territory of reversible ischemia, with soft and breast tissue attenuation artifact (BMI 50.2). Left ventricular size is dilated (EDV 137 cc).  Left ventricular wall thickness is preserved without regional motion abnormalities. LVEF 58%. No prior study for comparison.  Low risk study.   Heart Catheterization: None  LABORATORY DATA: CBC Latest Ref Rng & Units 09/22/2014 08/19/2012 08/19/2012  WBC 4.0 - 10.5 K/uL 6.0 8.7 8.3  Hemoglobin 12.0 - 15.0 g/dL 78.215.0 95.614.1 21.314.2  Hematocrit 36.0 - 46.0 % 43.7 41.2 46.6  Platelets 150 - 400 K/uL 353 335 -    CMP Latest Ref Rng & Units 12/12/2020 11/09/2020 07/13/2020  Glucose 65 - 99 mg/dL 086(V107(H) 784(O105(H) 78  BUN 6 - 20 mg/dL 15 12 19   Creatinine 0.57 - 1.00 mg/dL 9.62(X1.31(H) 5.28(U1.35(H) 1.32(G1.45(H)  Sodium 134 - 144 mmol/L  137 138 136  Potassium 3.5 - 5.2 mmol/L 4.7 4.1 4.2  Chloride 96 - 106 mmol/L 101 99 100  CO2 20 - 29 mmol/L 23 26 20   Calcium 8.7 - 10.2 mg/dL 9.7 9.9 9.8  Total Protein 6.5 - 8.1 g/dL - - -  Total Bilirubin 0.3 - 1.2 mg/dL - - -  Alkaline Phos 38 - 126 U/L - - -  AST 15 - 41 U/L - - -  ALT 0 - 44 U/L - - -    Lipid Panel  Lab Results  Component Value Date   CHOL 141 12/12/2020   HDL 30 (L) 12/12/2020    LDLCALC 86 12/12/2020   LDLDIRECT 75 11/09/2020   TRIG 140 12/12/2020   CHOLHDL 6.9 (H) 07/13/2020   No components found for: NTPROBNP Recent Labs    12/12/20 0857  PROBNP 32   No results for input(s): TSH in the last 8760 hours.  BMPd Recent Labs    07/13/20 1834 11/09/20 0850 12/12/20 0857  NA 136 138 137  K 4.2 4.1 4.7  CL 100 99 101  CO2 20 26 23   GLUCOSE 78 105* 107*  BUN 19 12 15   CREATININE 1.45* 1.35* 1.31*  CALCIUM 9.8 9.9 9.7  GFRNONAA 47* 52* 54*  GFRAA 55* 60 62    HEMOGLOBIN A1C Lab Results  Component Value Date   HGBA1C 5.5 08/19/2012   MPG 111 08/19/2012    IMPRESSION:    ICD-10-CM   1. Chronic combined systolic and diastolic congestive heart failure (HCC)  I50.42 Basic metabolic panel    Magnesium    Pro b natriuretic peptide (BNP)  2. Mixed hyperlipidemia  E78.2   3. Essential hypertension  I10   4. Former smoker  Z87.891   5. Class 3 severe obesity due to excess calories with serious comorbidity and body mass index (BMI) of 45.0 to 49.9 in adult [facility])  E66.01    Z68.42      RECOMMENDATIONS: Robin May is a 35 y.o. female whose past medical history and cardiac risk factors include: Hypertension, obesity due to excess calories (BMI 50), former smoker, chronic systolic and diastolic heart failure.  Chronic systolic and diastolic heart failure, stage C, NYHA class II:  No recent hospitalizations for congestive heart failure.  Medications reconciled.  Check BMP, magnesium, and NT proBNP  Labs are within normal limits will increase Entresto to 97/103 mg p.o. twice daily.  Mixed hyperlipidemia:  Fasting lipid profile from 04/12/2020 reviewed.  Continue statin therapy.  Benign essential hypertension:  Office blood pressure is currently at goal.  However, patient states that her home blood pressures are better managed.  Low-salt diet recommended.  Patient is asked to keep a log of her blood pressures and to bring it in at the  next office visit.  Currently managed by primary care provider.  Obesity, due to excess calories: Body mass index is 47.65 kg/m. . I reviewed with the patient the importance of diet, regular physical activity/exercise, weight loss.   . Patient is educated on increasing physical activity gradually as tolerated.  With the goal of moderate intensity exercise for 30 minutes a day 5 days a week.  FINAL MEDICATION LIST END OF ENCOUNTER: No orders of the defined types were placed in this encounter.   There are no discontinued medications.   Current Outpatient Medications:  .  amLODipine (NORVASC) 10 MG tablet, TAKE 1 TABLET BY MOUTH ONCE DAILY, Disp: 90 tablet, Rfl: 2 .  atorvastatin (  LIPITOR) 40 MG tablet, Take 1 tablet (40 mg total) by mouth daily., Disp: 90 tablet, Rfl: 3 .  carvedilol (COREG) 25 MG tablet, TAKE 1 AND 1/2 TABLETS BY MOUTH TWICE DAILY WITH MEALS, Disp: 90 tablet, Rfl: 0 .  ENTRESTO 49-51 MG, TAKE 1 TABLET BY MOUTH 2 TIMES DAILY, Disp: 60 tablet, Rfl: 0 .  sodium chloride (OCEAN) 0.65 % SOLN nasal spray, Place 1 spray into both nostrils as needed for congestion., Disp: 44 mL, Rfl: 0 .  spironolactone (ALDACTONE) 50 MG tablet, TAKE 1 TABLET BY MOUTH ONCE DAILY, Disp: 90 tablet, Rfl: 2  Orders Placed This Encounter  Procedures  . Basic metabolic panel  . Magnesium  . Pro b natriuretic peptide (BNP)    There are no Patient Instructions on file for this visit.   --Continue cardiac medications as reconciled in final medication list. --Return in about 3 months (around 04/19/2021) for Follow up, heart failure management.. Or sooner if needed. --Continue follow-up with your primary care physician regarding the management of your other chronic comorbid conditions.  Patient's questions and concerns were addressed to her satisfaction. She voices understanding of the instructions provided during this encounter.   This note was created using a voice recognition software as a result  there may be grammatical errors inadvertently enclosed that do not reflect the nature of this encounter. Every attempt is made to correct such errors.  Tessa Lerner, [place], [facility]  Pager: [phone] Office: [phone]

## 2021-01-20 ENCOUNTER — Other Ambulatory Visit: Payer: Self-pay

## 2021-01-20 DIAGNOSIS — E782 Mixed hyperlipidemia: Secondary | ICD-10-CM

## 2021-01-20 DIAGNOSIS — I5042 Chronic combined systolic (congestive) and diastolic (congestive) heart failure: Secondary | ICD-10-CM

## 2021-01-20 LAB — BASIC METABOLIC PANEL
BUN/Creatinine Ratio: 12 (ref 9–23)
BUN: 16 mg/dL (ref 6–20)
CO2: 23 mmol/L (ref 20–29)
Calcium: 9.9 mg/dL (ref 8.7–10.2)
Chloride: 103 mmol/L (ref 96–106)
Creatinine, Ser: 1.36 mg/dL — ABNORMAL HIGH (ref 0.57–1.00)
GFR calc Af Amer: 59 mL/min/{1.73_m2} — ABNORMAL LOW (ref >59)
GFR calc non Af Amer: 51 mL/min/{1.73_m2} — ABNORMAL LOW (ref >59)
Glucose: 87 mg/dL (ref 65–99)
Potassium: 4.7 mmol/L (ref 3.5–5.2)
Sodium: 138 mmol/L (ref 134–144)

## 2021-01-20 LAB — MAGNESIUM: Magnesium: 1.8 mg/dL (ref 1.6–2.3)

## 2021-01-20 LAB — PRO B NATRIURETIC PEPTIDE: NT-Pro BNP: 90 pg/mL (ref 0–130)

## 2021-02-03 NOTE — Telephone Encounter (Signed)
From patient.

## 2021-02-17 ENCOUNTER — Other Ambulatory Visit: Payer: Self-pay

## 2021-02-17 DIAGNOSIS — I10 Essential (primary) hypertension: Secondary | ICD-10-CM

## 2021-02-17 DIAGNOSIS — I5042 Chronic combined systolic (congestive) and diastolic (congestive) heart failure: Secondary | ICD-10-CM

## 2021-02-17 DIAGNOSIS — E782 Mixed hyperlipidemia: Secondary | ICD-10-CM

## 2021-02-17 MED ORDER — ENTRESTO 97-103 MG PO TABS: 1.0000 | ORAL_TABLET | Freq: Two times a day (BID) | ORAL | 0 refills | Status: DC

## 2021-02-17 MED FILL — ENTRESTO 97 MG-103 MG TAB: 97-103 | 30 days supply | Qty: 60 | Fill #0

## 2021-02-17 NOTE — Telephone Encounter (Signed)
I called patient wasn't able to reach patient left a vm will try again later

## 2021-02-17 NOTE — Telephone Encounter (Signed)
Spoke to patient I sent in new rx Entresto 97-103mg  po bid and put in labs for patient to go get a week after per Dr Odis Hollingshead

## 2021-03-21 MED FILL — ENTRESTO 97 MG-103 MG TAB: 97-103 | 30 days supply | Qty: 60 | Fill #0

## 2021-04-01 LAB — BASIC METABOLIC PANEL
BUN/Creatinine Ratio: 12 (ref 9–23)
BUN: 16 mg/dL (ref 6–20)
CO2: 20 mmol/L (ref 20–29)
Calcium: 9.3 mg/dL (ref 8.7–10.2)
Chloride: 103 mmol/L (ref 96–106)
Creatinine, Ser: 1.32 mg/dL — ABNORMAL HIGH (ref 0.57–1.00)
Glucose: 92 mg/dL (ref 65–99)
Potassium: 3.9 mmol/L (ref 3.5–5.2)
Sodium: 139 mmol/L (ref 134–144)
eGFR: 55 mL/min/{1.73_m2} — ABNORMAL LOW (ref >59)

## 2021-04-01 LAB — PRO B NATRIURETIC PEPTIDE: NT-Pro BNP: 157 pg/mL — ABNORMAL HIGH (ref 0–130)

## 2021-04-01 LAB — MAGNESIUM: Magnesium: 1.9 mg/dL (ref 1.6–2.3)

## 2021-04-20 ENCOUNTER — Ambulatory Visit: Payer: 59 | Admitting: Cardiology

## 2021-04-20 ENCOUNTER — Other Ambulatory Visit: Payer: Self-pay

## 2021-04-20 ENCOUNTER — Encounter: Payer: Self-pay | Admitting: Cardiology

## 2021-04-20 VITALS — BP 137/86 | HR 71 | Temp 97.6°F | Resp 16 | Ht 63.0 in | Wt 276.2 lb

## 2021-04-20 DIAGNOSIS — I10 Essential (primary) hypertension: Secondary | ICD-10-CM

## 2021-04-20 DIAGNOSIS — E782 Mixed hyperlipidemia: Secondary | ICD-10-CM

## 2021-04-20 DIAGNOSIS — I5032 Chronic diastolic (congestive) heart failure: Secondary | ICD-10-CM

## 2021-04-20 DIAGNOSIS — Z87891 Personal history of nicotine dependence: Secondary | ICD-10-CM

## 2021-04-20 DIAGNOSIS — Z6841 Body Mass Index (BMI) 40.0 and over, adult: Secondary | ICD-10-CM

## 2021-04-20 NOTE — Progress Notes (Signed)
Date:  04/20/2021   ID:  Robin May, DOB [birth year], MRN [id number]  PCP:  Unknown JimMeccariello, Bailey J, DO  Cardiologist:  Tessa LernerSunit Necha Harries, DO, [facility] (established care 12/09/2020) Former Cardiology Providers: Dr. Yates DecampJay Ganji  Date: 04/20/21 Last Office Visit: 01/19/2021  Chief Complaint  Patient presents with  . Chronic combined systolic and diastolic congestive heart fa  . Follow-up    HPI  Robin May is a 35 y.o. female who presents to the office with a chief complaint of " congestive heart failure management." Patient's past medical history and cardiovascular risk factors include: Hypertension, obesity due to excess calories (BMI 47), former smoker, chronic systolic and diastolic heart failure.  She is referred to the office at the request of Meccariello, Solmon IceBailey J, * for evaluation of congestive heart failure.  Patient reestablished care back in December 2021 for congestive heart failure management after her insurance being reinstated.  Since then her medications have been titrated up slowly in a stepwise fashion.  Since last office visit patient states that she is doing well from a cardiovascular standpoint.  She denies any chest pain or heart failure symptoms.  No recent hospitalizations or urgent care visits for cardiovascular symptoms.    Unfortunately she has gained 7 pounds since last visit most likely secondary to dietary indiscretion.  As per her last echocardiogram her LVEF has improved to the low normal, grade 2 diastolic impairment, and elevated left atrial pressure.  Independently reviewed the most recent labs from April 2022.  FUNCTIONAL STATUS: Walks on the track about 6 times on the weekend.     ALLERGIES: No Known Allergies  MEDICATION LIST PRIOR TO VISIT: Current Meds  Medication Sig  . amLODipine (NORVASC) 10 MG tablet TAKE 1 TABLET BY MOUTH ONCE DAILY  . atorvastatin (LIPITOR) 40 MG tablet TAKE 1 TABLET BY MOUTH DAILY.  . carvedilol (COREG) 25 MG tablet  TAKE 1 AND 1/2 TABLETS BY MOUTH TWICE DAILY WITH MEALS  . sacubitril-valsartan (ENTRESTO) 97-103 MG TAKE 1 TABLET BY MOUTH 2 TIMES DAILY.  . sodium chloride (OCEAN) 0.65 % SOLN nasal spray Place 1 spray into both nostrils as needed for congestion.  Marland Kitchen. spironolactone (ALDACTONE) 50 MG tablet TAKE 1 TABLET BY MOUTH ONCE DAILY     PAST MEDICAL HISTORY: Past Medical History:  Diagnosis Date  . Allergy   . Anginal pain (HCC) 08/18/2012   "mild"  . CHF (congestive heart failure) (HCC)   . Heart failure (HCC) 2016  . Hyperlipidemia   . Hypertension   . Shortness of breath 08/19/2012   "w/exertion and w/lying down"    PAST SURGICAL HISTORY: Past Surgical History:  Procedure Laterality Date  . NO PAST SURGERIES      FAMILY HISTORY: The patient family history includes Asthma in her brother and sister; Diabetes in her father; Heart disease in her maternal grandfather; Hypertension in her maternal grandfather, maternal grandmother, mother, paternal grandfather, and paternal grandmother; Kidney failure in her maternal grandfather; Thyroid disease in her mother.  SOCIAL HISTORY:  The patient  reports that she quit smoking about 15 years ago. Her smoking use included cigars. She has never used smokeless tobacco. She reports previous alcohol use of about 1.0 - 2.0 standard drink of alcohol per week. She reports that she does not use drugs.  REVIEW OF SYSTEMS: Review of Systems  Constitutional: Negative for chills and fever.  HENT: Negative for hoarse voice and nosebleeds.   Eyes: Negative for discharge, double vision and pain.  Cardiovascular:  Negative for chest pain, claudication, dyspnea on exertion, leg swelling, near-syncope, orthopnea, palpitations, paroxysmal nocturnal dyspnea and syncope.  Respiratory: Negative for hemoptysis and shortness of breath.   Musculoskeletal: Negative for muscle cramps and myalgias.  Gastrointestinal: Negative for abdominal pain, constipation, diarrhea,  hematemesis, hematochezia, melena, nausea and vomiting.  Neurological: Negative for dizziness and light-headedness.    PHYSICAL EXAM: Vitals with BMI 04/20/2021 01/19/2021 12/09/2020  Height 5\' 3"  5\' 3"  5\' 3"   Weight 276 lbs 3 oz 269 lbs 283 lbs 10 oz  BMI 48.94 47.66 50.25  Systolic 137 132  Diastolic 86 83 75  Pulse 71 77 77   CONSTITUTIONAL: Well-developed and well-nourished. No acute distress.  SKIN: Skin is warm and dry. No rash noted. No cyanosis. No pallor. No jaundice HEAD: Normocephalic and atraumatic.  EYES: No scleral icterus MOUTH/THROAT: Moist oral membranes.  NECK: No JVD present. No thyromegaly noted. No carotid bruits  LYMPHATIC: No visible cervical adenopathy.  CHEST Normal respiratory effort. No intercostal retractions  LUNGS: Clear to auscultation bilaterally.  No stridor. No wheezes. No rales.  CARDIOVASCULAR: Regular rate and rhythm, positive S1-S2, no murmurs rubs or gallops appreciated. ABDOMINAL: Obese, soft, nontender, nondistended, positive bowel sounds in all 4 quadrants, no apparent ascites.  EXTREMITIES: No peripheral edema, warm to touch, 2+ dorsalis pedis and posterior tibial pulses. HEMATOLOGIC: No significant bruising NEUROLOGIC: Oriented to person, place, and time. Nonfocal. Normal muscle tone.  PSYCHIATRIC: Normal mood and affect. Normal behavior. Cooperative  CARDIAC DATABASE: EKG: 12/09/2020: Normal sinus rhythm, 81 bpm, T wave inversions in the high lateral leads possible ischemia, poor R wave progression, without underlying injury pattern.  Echocardiogram: 08/20/2012: LVEF 45-50%, diffuse hypokinesis, grade 3 diastolic dysfunction mild AR, mildly dilated left atrium, RVSP 31 mmHg.  12/20/2020:  Low normal LV systolic function with EF 53%. Left ventricle cavity is normal in size. Moderate concentric remodeling of the left ventricle. Normal global wall motion. Doppler evidence of grade II (pseudonormal) diastolic dysfunction, elevated LAP.  Calculated EF 53%. Trileaflet aortic valve. Mild (Grade I) aortic regurgitation. No significant change from the report of 08/20/2012.   Stress Testing: Lexiscan Sestamibi stress test 12/14/2020: Stress EKG is non-diagnostic, as this is pharmacological stress test using Lexiscan. Patient was scheduled for Exercise nuclear stress test; however, she did not reach 85% APMHR so transitioned to [place].  No apparent territory of reversible ischemia, with soft and breast tissue attenuation artifact (BMI 50.2). Left ventricular size is dilated (EDV 137 cc).  Left ventricular wall thickness is preserved without regional motion abnormalities. LVEF 58%. No prior study for comparison.  Low risk study.   Heart Catheterization: None  LABORATORY DATA: CBC Latest Ref Rng & Units 09/22/2014 08/19/2012 08/19/2012  WBC 4.0 - 10.5 K/uL 6.0 8.7 8.3  Hemoglobin 12.0 - 15.0 g/dL 09/24/2014 08/21/2012 08/21/2012  Hematocrit 36.0 - 46.0 % 43.7 41.2 46.6  Platelets 150 - 400 K/uL 353 335 -    CMP Latest Ref Rng & Units 03/31/2021 01/19/2021 12/12/2020  Glucose 65 - 99 mg/dL 92 87 05/31/2021)  BUN 6 - 20 mg/dL 16 16 15   Creatinine 0.57 - 1.00 mg/dL 01/21/2021) 12/14/2020) 790(W)  Sodium 134 - 144 mmol/L 139 138 137  Potassium 3.5 - 5.2 mmol/L 3.9 4.7 4.7  Chloride 96 - 106 mmol/L 103 103 101  CO2 20 - 29 mmol/L 20 23 23   Calcium 8.7 - 10.2 mg/dL 9.3 9.9 9.7  Total Protein 6.5 - 8.1 g/dL - - -  Total Bilirubin 0.3 - 1.2 mg/dL - - -  Alkaline Phos 38 - 126 U/L - - -  AST 15 - 41 U/L - - -  ALT 0 - 44 U/L - - -    Lipid Panel  Lab Results  Component Value Date   CHOL 141 12/12/2020   HDL 30 (L) 12/12/2020   LDLCALC 86 12/12/2020   LDLDIRECT 75 11/09/2020   TRIG 140 12/12/2020   CHOLHDL 6.9 (H) 07/13/2020   No components found for: NTPROBNP Recent Labs    12/12/20 0857 01/19/21 1038 03/31/21 1613  PROBNP 32 90 157*   No results for input(s): TSH in the last 8760 hours.  BMP Recent Labs    11/09/20 0850 12/12/20 0857  01/19/21 1038 03/31/21 1612  NA 138 137 138 139  K 4.1 4.7 4.7 3.9  CL 99 101 103 103  CO2 26 23 23 20   GLUCOSE 105* 107* 87 92  BUN 12 15 16 16   CREATININE 1.35* 1.31* 1.36* 1.32*  CALCIUM 9.9 9.7 9.9 9.3  GFRNONAA 52* 54* 51*  --   GFRAA 60 62 59*  --     HEMOGLOBIN A1C Lab Results  Component Value Date   HGBA1C 5.5 08/19/2012   MPG 111 08/19/2012    IMPRESSION:    ICD-10-CM   1. Chronic heart failure with preserved ejection fraction (HFpEF) (HCC)  I50.32   2. Essential hypertension  I10   3. Mixed hyperlipidemia  E78.2   4. Former smoker  Z87.891   5. Class 3 severe obesity due to excess calories with serious comorbidity and body mass index (BMI) of 45.0 to 49.9 in adult [facility]  E66.01    Z68.42      RECOMMENDATIONS: JAQUASIA DOSCHER is a 35 y.o. female whose past medical history and cardiac risk factors include: Hypertension, obesity due to excess calories (BMI 50), former smoker, chronic systolic and diastolic heart failure.  Chronic HFpEF, stage C, NYHA class II:  Compared to 2013 her LVEF has improved.  No recent hospitalizations for congestive heart failure.  Medications reconciled.  Independently reviewed labs from April 2022.  Discussed the addition of Jardiance at today's office visit.  After reviewing that the medication profile patient wants to hold off on additional GDMT as she remains asymptomatic.  Mixed hyperlipidemia:  Continue statin therapy.  Does not endorse myalgias.  Currently managed by primary care provider.  Benign essential hypertension:  Office blood pressure within acceptable range.  However, home blood pressures are better managed.  Low-salt diet recommended.  Patient is asked to keep a log of her blood pressures and to bring it in at the next office visit.  Currently managed by primary care provider.  Obesity, due to excess calories: Body mass index is 48.93 kg/m. . I reviewed with the patient the importance of diet,  regular physical activity/exercise, weight loss.   . Patient is educated on increasing physical activity gradually as tolerated.  With the goal of moderate intensity exercise for 30 minutes a day 5 days a week.  FINAL MEDICATION LIST END OF ENCOUNTER: No orders of the defined types were placed in this encounter.   There are no discontinued medications.   Current Outpatient Medications:  .  amLODipine (NORVASC) 10 MG tablet, TAKE 1 TABLET BY MOUTH ONCE DAILY, Disp: 90 tablet, Rfl: 2 .  atorvastatin (LIPITOR) 40 MG tablet, TAKE 1 TABLET BY MOUTH DAILY., Disp: 90 tablet, Rfl: 3 .  carvedilol (COREG) 25 MG tablet, TAKE 1 AND 1/2 TABLETS BY MOUTH TWICE DAILY WITH MEALS, Disp:  90 tablet, Rfl: 0 .  sacubitril-valsartan (ENTRESTO) 97-103 MG, TAKE 1 TABLET BY MOUTH 2 TIMES DAILY., Disp: 60 tablet, Rfl: 0 .  sodium chloride (OCEAN) 0.65 % SOLN nasal spray, Place 1 spray into both nostrils as needed for congestion., Disp: 44 mL, Rfl: 0 .  spironolactone (ALDACTONE) 50 MG tablet, TAKE 1 TABLET BY MOUTH ONCE DAILY, Disp: 90 tablet, Rfl: 2  No orders of the defined types were placed in this encounter.  There are no Patient Instructions on file for this visit.   --Continue cardiac medications as reconciled in final medication list. --Return in about 8 months (around 12/20/2021) for Follow up, heart failure management.. Or sooner if needed. --Continue follow-up with your primary care physician regarding the management of your other chronic comorbid conditions.  Patient's questions and concerns were addressed to her satisfaction. She voices understanding of the instructions provided during this encounter.   This note was created using a voice recognition software as a result there may be grammatical errors inadvertently enclosed that do not reflect the nature of this encounter. Every attempt is made to correct such errors.  Tessa Lerner, [place], [facility]  Pager: [phone] Office: [phone]

## 2021-05-08 ENCOUNTER — Other Ambulatory Visit (HOSPITAL_COMMUNITY): Payer: Self-pay

## 2021-05-08 MED FILL — Amlodipine Besylate Tab 10 MG (Base Equivalent): ORAL | 90 days supply | Qty: 90 | Fill #0 | Status: AC

## 2021-05-08 MED FILL — Spironolactone Tab 50 MG: ORAL | 90 days supply | Qty: 90 | Fill #0 | Status: AC

## 2021-05-16 ENCOUNTER — Other Ambulatory Visit: Payer: Self-pay

## 2021-05-16 ENCOUNTER — Ambulatory Visit (INDEPENDENT_AMBULATORY_CARE_PROVIDER_SITE_OTHER): Payer: 59 | Admitting: Family Medicine

## 2021-05-16 ENCOUNTER — Encounter: Payer: Self-pay | Admitting: Family Medicine

## 2021-05-16 VITALS — BP 140/78 | HR 83 | Ht 63.0 in | Wt 278.6 lb

## 2021-05-16 DIAGNOSIS — E782 Mixed hyperlipidemia: Secondary | ICD-10-CM | POA: Diagnosis not present

## 2021-05-16 DIAGNOSIS — I110 Hypertensive heart disease with heart failure: Secondary | ICD-10-CM | POA: Diagnosis not present

## 2021-05-16 DIAGNOSIS — Z23 Encounter for immunization: Secondary | ICD-10-CM

## 2021-05-16 DIAGNOSIS — N1831 Chronic kidney disease, stage 3a: Secondary | ICD-10-CM | POA: Diagnosis not present

## 2021-05-16 DIAGNOSIS — I10 Essential (primary) hypertension: Secondary | ICD-10-CM | POA: Diagnosis not present

## 2021-05-16 NOTE — Assessment & Plan Note (Signed)
BP slightly above goal in office, but reports better BPs at home.  We will continue with her current medications.  No labs needed today.

## 2021-05-16 NOTE — Assessment & Plan Note (Signed)
Discussed diagnosis.  Creatinine was at baseline in April.  Advised against NSAID use.

## 2021-05-16 NOTE — Assessment & Plan Note (Signed)
Continue her current regimen.  LDL is at goal in December.

## 2021-05-16 NOTE — Patient Instructions (Signed)
Thank you for coming to see me today. It was a pleasure. Today we talked about:   Keep up the great work with your health!  No changes today.  No labs needed today.    Please follow-up with new PCP in 3 months.  If you have any questions or concerns, please do not hesitate to call the office at [phone].  Best,   Luis Abed, DO

## 2021-05-16 NOTE — Assessment & Plan Note (Signed)
Stable.  Following with cardiology.  No signs of fluid overload.  Last echo in December.  Continue current medications.

## 2021-05-16 NOTE — Progress Notes (Signed)
    SUBJECTIVE:   CHIEF COMPLAINT / HPI:   HTN Current regimen: Norvasc 10mg , Coreg 25mg  BID, Entresto 97-103mg , Spironolactone 50mg   BPs at home have been 139/80 this AM, usually the highest is 145 when she is running around, lowest is usually 130s/77 Last BMP 03/31/2021, creatinine 1.32, K3.9 Last seen by cardiology on 04/20/2021, no changes were made  Hyperlipidemia Current regimen: Lipitor 40 mg daily Last lipid panel 12/12/2020, LDL 86  CKD stage III a patient has had consistent creatinine and new CKD 3A range, has been stable Last BMP on 03/31/2021, creatinine 1.32 with GFR 55  HFpEF Last echo on 12/20/2020, EF 53%, G2DD, grade 1 aortic regurg Denies chest pain, shortness of breath, leg edema  COVID vaccination: declines  PERTINENT  PMH / PSH: HTN, HFpEF, CKD 3a, Morbid Obesity, HLD  OBJECTIVE:   BP 140/78   Pulse 83   Ht 5\' 3"  (1.6 m)   Wt 278 lb 9.6 oz (126.4 kg)   LMP 04/29/2021   SpO2 98%   BMI 49.35 kg/m    Physical Exam:  General: 35 y.o. female in NAD Cardio: RRR no m/r/g Lungs: CTAB, no wheezing, no rhonchi, no crackles, no IWOB on RA Skin: warm and dry Extremities: No edema   ASSESSMENT/PLAN:   Essential hypertension BP slightly above goal in office, but reports better BPs at home.  We will continue with her current medications.  No labs needed today.  Hypertensive CHF (congestive heart failure) Stable.  Following with cardiology.  No signs of fluid overload.  Last echo in December.  Continue current medications.  CKD (chronic kidney disease) stage 3, GFR 30-59 ml/min Discussed diagnosis.  Creatinine was at baseline in April.  Advised against NSAID use.  Mixed hyperlipidemia Continue her current regimen.  LDL is at goal in December.   Follow-up in 3 months.  33, DO [facility]

## 2021-06-29 ENCOUNTER — Other Ambulatory Visit: Payer: Self-pay | Admitting: Family Medicine

## 2021-06-29 ENCOUNTER — Other Ambulatory Visit (HOSPITAL_COMMUNITY): Payer: Self-pay

## 2021-06-29 ENCOUNTER — Other Ambulatory Visit: Payer: Self-pay | Admitting: Cardiology

## 2021-06-29 DIAGNOSIS — I5032 Chronic diastolic (congestive) heart failure: Secondary | ICD-10-CM

## 2021-06-29 MED ORDER — CARVEDILOL 25 MG PO TABS
ORAL_TABLET | ORAL | 0 refills | Status: DC
  Filled 2021-06-29: qty 90, 30d supply, fill #0

## 2021-06-29 MED ORDER — ENTRESTO 97-103 MG PO TABS
1.0000 | ORAL_TABLET | Freq: Two times a day (BID) | ORAL | 0 refills | Status: DC
  Filled 2021-06-29: qty 60, 30d supply, fill #0

## 2021-06-29 MED FILL — Atorvastatin Calcium Tab 40 MG (Base Equivalent): ORAL | 90 days supply | Qty: 90 | Fill #0 | Status: AC

## 2021-06-30 ENCOUNTER — Other Ambulatory Visit (HOSPITAL_COMMUNITY): Payer: Self-pay

## 2021-08-09 ENCOUNTER — Other Ambulatory Visit: Payer: Self-pay

## 2021-08-09 ENCOUNTER — Encounter: Payer: Self-pay | Admitting: Emergency Medicine

## 2021-08-09 ENCOUNTER — Ambulatory Visit
Admission: EM | Admit: 2021-08-09 | Discharge: 2021-08-09 | Disposition: A | Discharge status: Home / Self Care | Payer: 59 | Attending: Urgent Care | Admitting: Urgent Care

## 2021-08-09 DIAGNOSIS — R7989 Other specified abnormal findings of blood chemistry: Secondary | ICD-10-CM

## 2021-08-09 DIAGNOSIS — I509 Heart failure, unspecified: Secondary | ICD-10-CM

## 2021-08-09 DIAGNOSIS — M5431 Sciatica, right side: Secondary | ICD-10-CM

## 2021-08-09 MED ORDER — NAPROXEN 500 MG PO TABS: 500.0000 mg | ORAL_TABLET | Freq: Two times a day (BID) | ORAL | 0 refills | Status: DC

## 2021-08-09 MED ORDER — TIZANIDINE HCL 4 MG PO TABS: 4.0000 mg | ORAL_TABLET | Freq: Three times a day (TID) | ORAL | 0 refills | Status: DC | PRN

## 2021-08-09 NOTE — ED Triage Notes (Signed)
Patient c/o right sided sciatic nerve pain going down leg since Monday.  No apparent injury.  Patient has taken Ibuprofen for the discomfort.

## 2021-08-09 NOTE — ED Provider Notes (Signed)
[place]   MRN: [id number] DOB: 01/15/1986  Subjective:   Robin May is a 35 y.o. female with pmh of chronic CHF, CKD III presenting for 3-day history of acute onset recurrent right-sided buttock pain that radiates into her thigh all the way down to her feet.  Has had a history of sciatica and reports that it is flaring up on her again.  Has used ibuprofen with minimal relief.  Has not seen a back specialist for this.  Denies fall, trauma, weakness, numbness or tingling, changes to bowel or urinary habits.  Last echocardiogram was December 2021, EF of 53% was calculated at the time.  No current facility-administered medications for this encounter.  Current Outpatient Medications:    amLODipine (NORVASC) 10 MG tablet, TAKE 1 TABLET BY MOUTH ONCE DAILY, Disp: 90 tablet, Rfl: 2   atorvastatin (LIPITOR) 40 MG tablet, TAKE 1 TABLET BY MOUTH DAILY., Disp: 90 tablet, Rfl: 3   carvedilol (COREG) 25 MG tablet, Take 1 and 1/2 tablets by mouth twice daily with meals, Disp: 90 tablet, Rfl: 0   sacubitril-valsartan (ENTRESTO) 97-103 MG, Take 1 tablet by mouth 2 (two) times daily., Disp: 60 tablet, Rfl: 0   sodium chloride (OCEAN) 0.65 % SOLN nasal spray, Place 1 spray into both nostrils as needed for congestion., Disp: 44 mL, Rfl: 0   spironolactone (ALDACTONE) 50 MG tablet, TAKE 1 TABLET BY MOUTH ONCE DAILY, Disp: 90 tablet, Rfl: 2   No Known Allergies  Past Medical History:  Diagnosis Date   Allergy    Anginal pain ([place]) 08/18/2012   "mild"   CHF (congestive heart failure) (HCC)    Heart failure ([place]) 2016   Hyperlipidemia    Hypertension    Shortness of breath 08/19/2012   "w/exertion and w/lying down"     Past Surgical History:  Procedure Laterality Date   NO PAST SURGERIES      Family History  Problem Relation Age of Onset   Hypertension Mother    Thyroid disease Mother    Hypertension Maternal Grandmother    Hypertension Maternal Grandfather    Heart  disease Maternal Grandfather    Kidney failure Maternal Grandfather    Hypertension Paternal Grandmother    Hypertension Paternal Grandfather    Diabetes Father    Asthma Sister    Asthma Brother     Social History   Tobacco Use   Smoking status: Former    Types: Cigars    Quit date: 12/31/2005    Years since quitting: 15.6   Smokeless tobacco: Never   Tobacco comments:    08/19/2012 "smoked black and milds"  Substance Use Topics   Alcohol use: Not Currently    Alcohol/week: 1.0 - 2.0 standard drink    Types: 1 - 2 Glasses of wine per week    Comment: wine   Drug use: No    ROS   Objective:   Vitals: BP (!) 150/97 (BP Location: Right Arm)   Pulse 72   Temp 98.6 F (37 C) (Oral)   Ht _0  (1.6 m)   Wt 262 lb (118.8 kg)   LMP 07/08/2021   SpO2 96%   BMI 46.41 kg/m   Physical Exam Constitutional:      General: She is not in acute distress.    Appearance: Normal appearance. She is well-developed. She is not ill-appearing, toxic-appearing or diaphoretic.  HENT:     Head: Normocephalic and atraumatic.     Nose: Nose normal.  Mouth/Throat:     Mouth: Mucous membranes are moist.     Pharynx: Oropharynx is clear.  Eyes:     General: No scleral icterus.       Right eye: No discharge.        Left eye: No discharge.     Extraocular Movements: Extraocular movements intact.     Conjunctiva/sclera: Conjunctivae normal.     Pupils: Pupils are equal, round, and reactive to light.  Cardiovascular:     Rate and Rhythm: Normal rate.  Pulmonary:     Effort: Pulmonary effort is normal.  Musculoskeletal:     Lumbar back: Spasms and tenderness (over area outlined) present. No swelling, edema, deformity, signs of trauma, lacerations or bony tenderness. Normal range of motion. Positive right straight leg raise test. Negative left straight leg raise test. No scoliosis.       Back:  Skin:    General: Skin is warm and dry.  Neurological:     General: No focal deficit  present.     Mental Status: She is alert and oriented to person, place, and time.     Motor: No weakness.     Coordination: Coordination normal.     Gait: Gait normal.     Deep Tendon Reflexes: Reflexes normal.  Psychiatric:        Mood and Affect: Mood normal.        Behavior: Behavior normal.        Thought Content: Thought content normal.        Judgment: Judgment normal.    Last basic metabolic panel showed a creatinine level of 1.66m/dL 03/31/2021, eGFR was 55 mL/min.   Assessment and Plan :   PDMP not reviewed this encounter.  1. Sciatica of right side   2. Elevated serum creatinine   3. Congestive heart failure, unspecified HF chronicity, unspecified heart failure type ([place]     CrCL calculated at 795mmin.  We will follow general recommendations for management of sciatica that is uncomplicated.  Start naproxen, tizanidine.  Follow-up with spine specialist.  Deferred imaging as there is no recent fall, trauma, physical exam findings warranting this. Counseled patient on potential for adverse effects with medications prescribed/recommended today, ER and return-to-clinic precautions discussed, patient verbalized understanding.    MaJaynee EaglesPA-C 08/09/21 1740

## 2021-10-18 ENCOUNTER — Other Ambulatory Visit: Payer: Self-pay | Admitting: Cardiology

## 2021-10-18 ENCOUNTER — Other Ambulatory Visit: Payer: Self-pay | Admitting: Family Medicine

## 2021-10-18 DIAGNOSIS — E782 Mixed hyperlipidemia: Secondary | ICD-10-CM

## 2021-10-18 MED FILL — Amlodipine Besylate Tab 10 MG (Base Equivalent): ORAL | 90 days supply | Qty: 90 | Fill #1 | Status: AC

## 2021-10-18 MED FILL — Spironolactone Tab 50 MG: ORAL | 90 days supply | Qty: 90 | Fill #1 | Status: AC

## 2021-10-19 ENCOUNTER — Other Ambulatory Visit (HOSPITAL_COMMUNITY): Payer: Self-pay

## 2021-10-19 MED ORDER — ENTRESTO 97-103 MG PO TABS
1.0000 | ORAL_TABLET | Freq: Two times a day (BID) | ORAL | 0 refills | Status: DC
  Filled 2021-10-19: qty 60, 30d supply, fill #0

## 2021-10-20 ENCOUNTER — Other Ambulatory Visit (HOSPITAL_COMMUNITY): Payer: Self-pay

## 2021-10-20 MED ORDER — ATORVASTATIN CALCIUM 40 MG PO TABS
ORAL_TABLET | Freq: Every day | ORAL | 3 refills | Status: DC
Start: 2021-10-20 — End: 2022-05-10
  Filled 2021-10-20: qty 90, 90d supply, fill #0

## 2021-12-20 ENCOUNTER — Ambulatory Visit: Payer: 59 | Admitting: Cardiology

## 2022-01-18 ENCOUNTER — Ambulatory Visit: Payer: Commercial Managed Care - HMO | Admitting: Cardiology

## 2022-01-18 ENCOUNTER — Other Ambulatory Visit (HOSPITAL_COMMUNITY): Payer: Self-pay

## 2022-01-18 ENCOUNTER — Encounter: Payer: Self-pay | Admitting: Cardiology

## 2022-01-18 ENCOUNTER — Other Ambulatory Visit: Payer: Self-pay

## 2022-01-18 VITALS — BP 148/84 | HR 71 | Temp 98.1°F | Resp 16 | Ht 63.0 in | Wt 285.8 lb

## 2022-01-18 DIAGNOSIS — I10 Essential (primary) hypertension: Secondary | ICD-10-CM

## 2022-01-18 DIAGNOSIS — E782 Mixed hyperlipidemia: Secondary | ICD-10-CM

## 2022-01-18 DIAGNOSIS — Z87891 Personal history of nicotine dependence: Secondary | ICD-10-CM

## 2022-01-18 DIAGNOSIS — I5032 Chronic diastolic (congestive) heart failure: Secondary | ICD-10-CM

## 2022-01-18 MED ORDER — DAPAGLIFLOZIN PROPANEDIOL 10 MG PO TABS
10.0000 mg | ORAL_TABLET | Freq: Every day | ORAL | 0 refills | Status: DC
  Filled 2022-01-18 – 2022-02-07 (×2): qty 90, 90d supply, fill #0

## 2022-01-18 NOTE — Progress Notes (Signed)
Date:  01/18/2022   ID:  Robin May, DOB 1986-07-24, MRN [id number]  PCP:  Lattie Haw, MD  Cardiologist:  Rex Kras, DO, [facility] (established care 12/09/2020) Former Cardiology Providers: Dr. Adrian Prows  Date: 01/18/22 Last Office Visit: 04/20/2021  Chief Complaint  Patient presents with   heart failure management   Follow-up    HPI  Robin May is a 36 y.o. female who presents to the office with a chief complaint of " 9 month congestive heart failure management." Patient's past medical history and cardiovascular risk factors include: Hypertension, obesity due to excess calories (BMI 47), former smoker, chronic systolic and diastolic heart failure.  She is referred to the office at the request of Robin May, * for evaluation of congestive heart failure.  Reestablish care in December 2021 for heart failure management after insurance was reinstated.  Her medications have been uptitrated currently on Entresto 97/103 mg p.o. twice daily.  Patient was supposed to be coming in for an office visit for further up titration of GDMT.  However, over the last 1 year patient states that she has had busy personal life with regards to her car not working and not taking care of her father.  Over the last 1 year has gained 9 pounds due to dietary indiscretion.  No hospitalizations or urgent care visits for cardiovascular symptoms.  Her last echocardiogram noted low normal LVEF with grade 2 diastolic dysfunction.  Patient states that her primary physician is also left the practice and is in the process of finding a new one.  She has not had any labs for the last year and a half or so.  FUNCTIONAL STATUS: Walks on the track about 6 times on the weekend.     ALLERGIES: No Known Allergies  MEDICATION LIST PRIOR TO VISIT: Current Meds  Medication Sig   atorvastatin (LIPITOR) 40 MG tablet TAKE 1 TABLET BY MOUTH DAILY.   carvedilol (COREG) 25 MG tablet Take 1 and 1/2 tablets  by mouth twice daily with meals   dapagliflozin propanediol (FARXIGA) 10 MG TABS tablet Take 1 tablet (10 mg total) by mouth daily before breakfast.   naproxen (NAPROSYN) 500 MG tablet Take 1 tablet (500 mg total) by mouth 2 (two) times daily with a meal.   sacubitril-valsartan (ENTRESTO) 97-103 MG Take 1 tablet by mouth 2 (two) times daily.   sodium chloride (OCEAN) 0.65 % SOLN nasal spray Place 1 spray into both nostrils as needed for congestion.   spironolactone (ALDACTONE) 50 MG tablet TAKE 1 TABLET BY MOUTH ONCE DAILY   [DISCONTINUED] amLODipine (NORVASC) 10 MG tablet TAKE 1 TABLET BY MOUTH ONCE DAILY     PAST MEDICAL HISTORY: Past Medical History:  Diagnosis Date   Allergy    Anginal pain ([place]) 08/18/2012   "mild"   CHF (congestive heart failure) ([place])    Heart failure ([place]) 2016   Hyperlipidemia    Hypertension    Shortness of breath 08/19/2012   "w/exertion and w/lying down"    PAST SURGICAL HISTORY: Past Surgical History:  Procedure Laterality Date   NO PAST SURGERIES      FAMILY HISTORY: The patient family history includes Asthma in her brother and sister; Diabetes in her father; Heart disease in her maternal grandfather; Hypertension in her maternal grandfather, maternal grandmother, mother, paternal grandfather, and paternal grandmother; Kidney failure in her maternal grandfather; Thyroid disease in her mother.  SOCIAL HISTORY:  The patient  reports that she quit smoking about 16  years ago. Her smoking use included cigars. She has never used smokeless tobacco. She reports that she does not currently use alcohol after a past usage of about 1.0 - 2.0 standard drink per week. She reports that she does not use drugs.  REVIEW OF SYSTEMS: Review of Systems  Constitutional: Negative for chills and fever.  HENT:  Negative for hoarse voice and nosebleeds.   Eyes:  Negative for discharge, double vision and pain.  Cardiovascular:  Negative for chest pain, claudication, dyspnea  on exertion, leg swelling, near-syncope, orthopnea, palpitations, paroxysmal nocturnal dyspnea and syncope.  Respiratory:  Negative for hemoptysis and shortness of breath.   Musculoskeletal:  Negative for muscle cramps and myalgias.  Gastrointestinal:  Negative for abdominal pain, constipation, diarrhea, hematemesis, hematochezia, melena, nausea and vomiting.  Neurological:  Negative for dizziness and light-headedness.   PHYSICAL EXAM: Vitals with BMI 01/18/2022 08/09/2021 05/16/2021  Height 5' 3"  5' 3"  5' 3"   Weight 285 lbs 13 oz 262 lbs 278 lbs 10 oz  BMI 50.64 44.03 47.42  Systolic 595 638 756  Diastolic 84 97 78  Pulse 71 72 83   CONSTITUTIONAL: Well-developed and well-nourished. No acute distress.  SKIN: Skin is warm and dry. No rash noted. No cyanosis. No pallor. No jaundice HEAD: Normocephalic and atraumatic.  EYES: No scleral icterus MOUTH/THROAT: Moist oral membranes.  NECK: No JVD present. No thyromegaly noted. No carotid bruits  LYMPHATIC: No visible cervical adenopathy.  CHEST Normal respiratory effort. No intercostal retractions  LUNGS: Clear to auscultation bilaterally.  No stridor. No wheezes. No rales.  CARDIOVASCULAR: Regular rate and rhythm, positive S1-S2, no murmurs rubs or gallops appreciated. ABDOMINAL: Obese, soft, nontender, nondistended, positive bowel sounds in all 4 quadrants, no apparent ascites.  EXTREMITIES: No peripheral edema, warm to touch, 2+ dorsalis pedis and posterior tibial pulses. HEMATOLOGIC: No significant bruising NEUROLOGIC: Oriented to person, place, and time. Nonfocal. Normal muscle tone.  PSYCHIATRIC: Normal mood and affect. Normal behavior. Cooperative  CARDIAC DATABASE: EKG: 01/18/2022: NSR, 69 bpm, without underlying injury pattern.  Echocardiogram: 08/20/2012: LVEF 45-50%, diffuse hypokinesis, grade 3 diastolic dysfunction mild AR, mildly dilated left atrium, RVSP 31 mmHg.  12/20/2020:  Low normal LV systolic function with EF 53%.  Left ventricle cavity is normal in size. Moderate concentric remodeling of the left ventricle. Normal global wall motion. Doppler evidence of grade II (pseudonormal) diastolic dysfunction, elevated LAP.  Trileaflet aortic valve. Mild (Grade I) aortic regurgitation. No significant change from the report of 08/20/2012.   Stress Testing: Lexiscan Sestamibi stress test 12/14/2020: Stress EKG is non-diagnostic, as this is pharmacological stress test using Lexiscan. Patient was scheduled for Exercise nuclear stress test; however, she did not reach 85% APMHR so transitioned to [place].  No apparent territory of reversible ischemia, with soft and breast tissue attenuation artifact (BMI 50.2). Left ventricular size is dilated (EDV 137 cc).  Left ventricular wall thickness is preserved without regional motion abnormalities. LVEF 58%. No prior study for comparison.  Low risk study.   Heart Catheterization: None  LABORATORY DATA: CBC Latest Ref Rng & Units 09/22/2014 08/19/2012 08/19/2012  WBC 4.0 - 10.5 K/uL 6.0 8.7 8.3  Hemoglobin 12.0 - 15.0 g/dL 15.0 14.1 14.2  Hematocrit 36.0 - 46.0 % 43.7 41.2 46.6  Platelets 150 - 400 K/uL 353 335 -    CMP Latest Ref Rng & Units 03/31/2021 01/19/2021 12/12/2020  Glucose 65 - 99 mg/dL 92 87 107(H)  BUN 6 - 20 mg/dL 16 16 15   Creatinine 0.57 - 1.00 mg/dL  1.32(H) 1.36(H) 1.31(H)  Sodium 134 - 144 mmol/L 139 138 137  Potassium 3.5 - 5.2 mmol/L 3.9 4.7 4.7  Chloride 96 - 106 mmol/L 103 103 101  CO2 20 - 29 mmol/L 20 23 23   Calcium 8.7 - 10.2 mg/dL 9.3 9.9 9.7  Total Protein 6.5 - 8.1 g/dL - - -  Total Bilirubin 0.3 - 1.2 mg/dL - - -  Alkaline Phos 38 - 126 U/L - - -  AST 15 - 41 U/L - - -  ALT 0 - 44 U/L - - -    Lipid Panel  Lab Results  Component Value Date   CHOL 141 12/12/2020   HDL 30 (L) 12/12/2020   LDLCALC 86 12/12/2020   LDLDIRECT 75 11/09/2020   TRIG 140 12/12/2020   CHOLHDL 6.9 (H) 07/13/2020   No components found for: NTPROBNP Recent  Labs    01/19/21 1038 03/31/21 1613  PROBNP 90 157*   No results for input(s): TSH in the last 8760 hours.  BMP Recent Labs    01/19/21 1038 03/31/21 1612  NA 138 139  K 4.7 3.9  CL 103 103  CO2 23 20  GLUCOSE 87 92  BUN 16 16  CREATININE 1.36* 1.32*  CALCIUM 9.9 9.3  GFRNONAA 51*  --   GFRAA 59*  --     HEMOGLOBIN A1C Lab Results  Component Value Date   HGBA1C 5.5 08/19/2012   MPG 111 08/19/2012    IMPRESSION:    ICD-10-CM   1. Chronic heart failure with preserved ejection fraction (HFpEF) (HCC)  I50.32 EKG 12-Lead    CMP14+EGFR    Pro b natriuretic peptide (BNP)    Lipid Panel With LDL/HDL Ratio    LDL cholesterol, direct    Magnesium    Ambulatory referral to Sleep Studies    dapagliflozin propanediol (FARXIGA) 10 MG TABS tablet    2. Essential hypertension  I10     3. Mixed hyperlipidemia  E78.2 Lipid Panel With LDL/HDL Ratio    LDL cholesterol, direct    4. Former smoker  Z87.891     5. Class 3 severe obesity due to excess calories with serious comorbidity and body mass index (BMI) of 45.0 to 49.9 in adult [facility])  E66.01    Z68.42        RECOMMENDATIONS: MYLA MAURIELLO is a 36 y.o. female whose past medical history and cardiac risk factors include: Hypertension, obesity due to excess calories (BMI 50), former smoker, chronic systolic and diastolic heart failure.  Chronic heart failure with preserved ejection fraction (HFpEF) ([place]) Diagnosed 2013, per patient. Medications reconciled. Will check BMP, CMP, fasting lipid profile on direct LDL for now. If the renal function is stable patient is asked to start Farxiga 10 mg p.o. daily and stop amlodipine. After taking Farxiga for 1 week she is asked to repeat her labs -these still need to be placed. If the repeat blood work after being on [place] note stable renal function she is asked to pick up the prescription from her local pharmacy she is also been given a patient assistance voucher card. Would  like to uptitrate her GDMT in a stepwise fashion. Reemphasized the importance of salt restriction, discussed the low Will refer to sleep medicine for evaluation of sleep apnea given her young age, neck anatomy, obesity, and HFpEF  Essential hypertension Within acceptable range, but not at goal. Medication changes as discussed above. We emphasized the importance of low-salt diet.  Patient states her diet is  high in canned tunafish and [employer]. Advised her to limit her sodium intake to no more than 1.5 g/day.  Mixed hyperlipidemia Currently on atorvastatin.   She denies myalgia or other side effects. Last lipid profile from 2021. Patient states that her PCP is no longer with the practice and she is in the process of finding a new provider.  In the interim I will check a fasting lipid profile and LFTs to make sure it is safe to continue statin therapy. Longitudinally will defer management to PCP  Former smoker Educated on the importance of continued smoking cessation.  Class 3 severe obesity due to excess calories with serious comorbidity and body mass index (BMI) of 45.0 to 49.9 in adult [facility]) Body mass index is 50.63 kg/m. I reviewed with the patient the importance of diet, regular physical activity/exercise, weight loss.   Patient is educated on increasing physical activity gradually as tolerated.  With the goal of moderate intensity exercise for 30 minutes a day 5 days a week.  FINAL MEDICATION LIST END OF ENCOUNTER: Meds ordered this encounter  Medications   dapagliflozin propanediol (FARXIGA) 10 MG TABS tablet    Sig: Take 1 tablet (10 mg total) by mouth daily before breakfast.    Dispense:  90 tablet    Refill:  0    Medications Discontinued During This Encounter  Medication Reason   tiZANidine (ZANAFLEX) 4 MG tablet    amLODipine (NORVASC) 10 MG tablet Change in therapy     Current Outpatient Medications:    atorvastatin (LIPITOR) 40 MG tablet, TAKE 1 TABLET BY  MOUTH DAILY., Disp: 90 tablet, Rfl: 3   carvedilol (COREG) 25 MG tablet, Take 1 and 1/2 tablets by mouth twice daily with meals, Disp: 90 tablet, Rfl: 0   dapagliflozin propanediol (FARXIGA) 10 MG TABS tablet, Take 1 tablet (10 mg total) by mouth daily before breakfast., Disp: 90 tablet, Rfl: 0   naproxen (NAPROSYN) 500 MG tablet, Take 1 tablet (500 mg total) by mouth 2 (two) times daily with a meal., Disp: 30 tablet, Rfl: 0   sacubitril-valsartan (ENTRESTO) 97-103 MG, Take 1 tablet by mouth 2 (two) times daily., Disp: 60 tablet, Rfl: 0   sodium chloride (OCEAN) 0.65 % SOLN nasal spray, Place 1 spray into both nostrils as needed for congestion., Disp: 44 mL, Rfl: 0   spironolactone (ALDACTONE) 50 MG tablet, TAKE 1 TABLET BY MOUTH ONCE DAILY, Disp: 90 tablet, Rfl: 2  Orders Placed This Encounter  Procedures   CMP14+EGFR   Pro b natriuretic peptide (BNP)   Lipid Panel With LDL/HDL Ratio   LDL cholesterol, direct   Magnesium   Ambulatory referral to Sleep Studies   EKG 12-Lead    There are no Patient Instructions on file for this visit.   --Continue cardiac medications as reconciled in final medication list. --Return in about 3 months (around 04/18/2022) for Follow up, heart failure management.. Or sooner if needed. --Continue follow-up with your primary care physician regarding the management of your other chronic comorbid conditions.  Patient's questions and concerns were addressed to her satisfaction. She voices understanding of the instructions provided during this encounter.   This note was created using a voice recognition software as a result there may be grammatical errors inadvertently enclosed that do not reflect the nature of this encounter. Every attempt is made to correct such errors.  Rex Kras, [place], [facility]  Pager: [phone] Office: [phone]

## 2022-01-25 ENCOUNTER — Other Ambulatory Visit: Payer: Self-pay | Admitting: Cardiology

## 2022-01-25 DIAGNOSIS — I5032 Chronic diastolic (congestive) heart failure: Secondary | ICD-10-CM

## 2022-01-25 LAB — CMP14+EGFR
ALT: 25 IU/L (ref 0–32)
AST: 14 IU/L (ref 0–40)
Albumin/Globulin Ratio: 1.4 (ref 1.2–2.2)
Albumin: 4.5 g/dL (ref 3.8–4.8)
Alkaline Phosphatase: 59 IU/L (ref 44–121)
BUN/Creatinine Ratio: 10 (ref 9–23)
BUN: 13 mg/dL (ref 6–20)
Bilirubin Total: 0.6 mg/dL (ref 0.0–1.2)
CO2: 24 mmol/L (ref 20–29)
Calcium: 10 mg/dL (ref 8.7–10.2)
Chloride: 100 mmol/L (ref 96–106)
Creatinine, Ser: 1.36 mg/dL — ABNORMAL HIGH (ref 0.57–1.00)
Globulin, Total: 3.2 g/dL (ref 1.5–4.5)
Glucose: 97 mg/dL (ref 70–99)
Potassium: 5 mmol/L (ref 3.5–5.2)
Sodium: 138 mmol/L (ref 134–144)
Total Protein: 7.7 g/dL (ref 6.0–8.5)
eGFR: 52 mL/min/{1.73_m2} — ABNORMAL LOW (ref >59)

## 2022-01-25 LAB — LIPID PANEL WITH LDL/HDL RATIO
Cholesterol, Total: 173 mg/dL (ref 100–199)
HDL: 35 mg/dL — ABNORMAL LOW (ref >39)
LDL Chol Calc (NIH): 105 mg/dL — ABNORMAL HIGH (ref 0–99)
LDL/HDL Ratio: 3 ratio (ref 0.0–3.2)
Triglycerides: 186 mg/dL — ABNORMAL HIGH (ref 0–149)
VLDL Cholesterol Cal: 33 mg/dL (ref 5–40)

## 2022-01-25 LAB — MAGNESIUM: Magnesium: 1.9 mg/dL (ref 1.6–2.3)

## 2022-01-25 LAB — PRO B NATRIURETIC PEPTIDE: NT-Pro BNP: 96 pg/mL (ref 0–130)

## 2022-01-25 LAB — LDL CHOLESTEROL, DIRECT: LDL Direct: 90 mg/dL (ref 0–99)

## 2022-01-26 ENCOUNTER — Other Ambulatory Visit (HOSPITAL_COMMUNITY): Payer: Self-pay

## 2022-01-26 MED ORDER — ENTRESTO 97-103 MG PO TABS
1.0000 | ORAL_TABLET | Freq: Two times a day (BID) | ORAL | 0 refills | Status: DC
  Filled 2022-01-26 (×3): qty 180, 90d supply, fill #0

## 2022-01-26 NOTE — Progress Notes (Signed)
Spoke to patient she is not taking naprosyn and she will start [place] and will try to watch what she eats

## 2022-01-30 ENCOUNTER — Other Ambulatory Visit: Payer: Self-pay | Admitting: Family Medicine

## 2022-01-30 DIAGNOSIS — I5032 Chronic diastolic (congestive) heart failure: Secondary | ICD-10-CM

## 2022-01-31 ENCOUNTER — Other Ambulatory Visit (HOSPITAL_COMMUNITY): Payer: Self-pay

## 2022-01-31 MED ORDER — SPIRONOLACTONE 50 MG PO TABS
ORAL_TABLET | Freq: Every day | ORAL | 2 refills | Status: DC
  Filled 2022-01-31: qty 90, 90d supply, fill #0

## 2022-02-02 ENCOUNTER — Other Ambulatory Visit (HOSPITAL_COMMUNITY): Payer: Self-pay

## 2022-02-06 LAB — PRO B NATRIURETIC PEPTIDE: NT-Pro BNP: 269 pg/mL — ABNORMAL HIGH (ref 0–130)

## 2022-02-06 LAB — BASIC METABOLIC PANEL
BUN/Creatinine Ratio: 10 (ref 9–23)
BUN: 14 mg/dL (ref 6–20)
CO2: 23 mmol/L (ref 20–29)
Calcium: 9.7 mg/dL (ref 8.7–10.2)
Chloride: 102 mmol/L (ref 96–106)
Creatinine, Ser: 1.37 mg/dL — ABNORMAL HIGH (ref 0.57–1.00)
Glucose: 101 mg/dL — ABNORMAL HIGH (ref 70–99)
Potassium: 4.5 mmol/L (ref 3.5–5.2)
Sodium: 140 mmol/L (ref 134–144)
eGFR: 52 mL/min/{1.73_m2} — ABNORMAL LOW (ref >59)

## 2022-02-06 LAB — MAGNESIUM: Magnesium: 2.1 mg/dL (ref 1.6–2.3)

## 2022-02-06 NOTE — Progress Notes (Signed)
Patient is aware 

## 2022-02-07 ENCOUNTER — Other Ambulatory Visit (HOSPITAL_COMMUNITY): Payer: Self-pay

## 2022-04-18 ENCOUNTER — Ambulatory Visit: Payer: Commercial Managed Care - HMO | Admitting: Cardiology

## 2022-05-10 ENCOUNTER — Ambulatory Visit: Payer: Commercial Managed Care - HMO | Admitting: Cardiology

## 2022-05-10 ENCOUNTER — Encounter: Payer: Self-pay | Admitting: Cardiology

## 2022-05-10 ENCOUNTER — Other Ambulatory Visit (HOSPITAL_COMMUNITY): Payer: Self-pay

## 2022-05-10 VITALS — BP 180/98 | HR 79 | Temp 98.0°F | Resp 16 | Ht 63.0 in | Wt 277.0 lb

## 2022-05-10 DIAGNOSIS — I10 Essential (primary) hypertension: Secondary | ICD-10-CM

## 2022-05-10 DIAGNOSIS — I5042 Chronic combined systolic (congestive) and diastolic (congestive) heart failure: Secondary | ICD-10-CM

## 2022-05-10 DIAGNOSIS — Z87891 Personal history of nicotine dependence: Secondary | ICD-10-CM

## 2022-05-10 DIAGNOSIS — E782 Mixed hyperlipidemia: Secondary | ICD-10-CM

## 2022-05-10 MED ORDER — ISOSORB DINITRATE-HYDRALAZINE 20-37.5 MG PO TABS
1.0000 | ORAL_TABLET | Freq: Three times a day (TID) | ORAL | 2 refills | Status: DC
  Filled 2022-05-10: qty 90, 30d supply, fill #0

## 2022-05-10 MED ORDER — ISOSORBIDE DINITRATE 20 MG PO TABS
20.0000 mg | ORAL_TABLET | Freq: Three times a day (TID) | ORAL | 2 refills | Status: DC
  Filled 2022-05-10: qty 90, 30d supply, fill #0
  Filled 2022-09-04: qty 90, 30d supply, fill #1

## 2022-05-10 MED ORDER — HYDRALAZINE HCL 25 MG PO TABS
37.5000 mg | ORAL_TABLET | Freq: Three times a day (TID) | ORAL | 2 refills | Status: DC
  Filled 2022-05-10: qty 135, 30d supply, fill #0

## 2022-05-10 NOTE — Progress Notes (Signed)
? ?Date:  05/10/2022  ? ?ID:  Robin May, DOB 1986/03/15, MRN [id number] ? ?PCP:  Lattie Haw, MD  ?Cardiologist:  Rex Kras, DO, [facility] (established care 12/09/2020) ?Former Cardiology Providers: Dr. Adrian Prows ? ?Date: 05/10/22 ?Last Office Visit: 01/18/2022 ? ?Chief Complaint  ?Patient presents with  ? CHRONIC HEART FAILURE  ? Follow-up  ?  3 MONTHS  ? ? ?HPI  ?Robin May is a 36 y.o. female whose past medical history and cardiovascular risk factors include: Hypertension, obesity due to excess calories (BMI 47), former smoker, chronic systolic and diastolic heart failure. ? ?She establish care in December 2021 after requiring her insurance for management of heart failure.  She was last seen in the office in January 2023 and since then she has been started on Farxiga and repeat labs notes stable renal function.  Medications reconciled.  Office blood pressures are not well controlled.  Patient states that her home blood pressures that range anywhere from 140-178 mmHg.  For reasons unknown she has not seek medical attention regarding this until now. ? ?Independently reviewed labs from January and February 2023 and noted below for further reference.  She has an appointment with sleep medicine on May 16, 2022.  Patient states that she is making every effort to cut down dietary intake of sodium.  She has reduced her consumption of canned tuna, eats lots of frozen foods and only eats out twice a week.  She denies anginal discomfort or heart failure symptoms. ? ?FUNCTIONAL STATUS: ?Walks on the track about 6 times on the weekend.   ?  ?ALLERGIES: ?No Known Allergies ? ?MEDICATION LIST PRIOR TO VISIT: ?Current Meds  ?Medication Sig  ? atorvastatin (LIPITOR) 40 MG tablet Take 40 mg by mouth daily.  ? carvedilol (COREG) 25 MG tablet Take 1 and 1/2 tablets by mouth twice daily with meals  ? dapagliflozin propanediol (FARXIGA) 10 MG TABS tablet Take 10 mg by mouth daily.  ? hydrALAZINE (APRESOLINE) 25 MG tablet Take  1.5 tablets (37.5 mg total) by mouth 3 (three) times daily.  ? isosorbide dinitrate (ISORDIL) 20 MG tablet Take 1 tablet (20 mg total) by mouth 3 (three) times daily.  ? sacubitril-valsartan (ENTRESTO) 97-103 MG Take 1 tablet by mouth 2 (two) times daily.  ? sodium chloride (OCEAN) 0.65 % SOLN nasal spray Place 1 spray into both nostrils as needed for congestion.  ? spironolactone (ALDACTONE) 25 MG tablet Take 25 mg by mouth daily.  ? [DISCONTINUED] isosorbide-hydrALAZINE (BIDIL) 20-37.5 MG tablet Take 1 tablet by mouth 3 (three) times daily.  ?  ? ?PAST MEDICAL HISTORY: ?Past Medical History:  ?Diagnosis Date  ? Allergy   ? Anginal pain ([place]) 08/18/2012  ? "mild"  ? CHF (congestive heart failure) ([place])   ? Heart failure ([place]) 2016  ? Hyperlipidemia   ? Hypertension   ? Shortness of breath 08/19/2012  ? "w/exertion and w/lying down"  ? ? ?PAST SURGICAL HISTORY: ?Past Surgical History:  ?Procedure Laterality Date  ? NO PAST SURGERIES    ? ? ?FAMILY HISTORY: ?The patient family history includes Asthma in her brother and sister; Diabetes in her father; Heart disease in her maternal grandfather; Hypertension in her maternal grandfather, maternal grandmother, mother, paternal grandfather, and paternal grandmother; Kidney failure in her maternal grandfather; Thyroid disease in her mother. ? ?SOCIAL HISTORY:  ?The patient  reports that she quit smoking about 16 years ago. Her smoking use included cigars. She has never used smokeless tobacco. She reports that  she does not drink alcohol and does not use drugs. ? ?REVIEW OF SYSTEMS: ?Review of Systems  ?Constitutional: Negative for chills and fever.  ?HENT:  Negative for hoarse voice and nosebleeds.   ?Eyes:  Negative for discharge, double vision and pain.  ?Cardiovascular:  Negative for chest pain, claudication, dyspnea on exertion, leg swelling, near-syncope, orthopnea, palpitations, paroxysmal nocturnal dyspnea and syncope.  ?Respiratory:  Negative for hemoptysis and  shortness of breath.   ?Musculoskeletal:  Negative for muscle cramps and myalgias.  ?Gastrointestinal:  Negative for abdominal pain, constipation, diarrhea, hematemesis, hematochezia, melena, nausea and vomiting.  ?Neurological:  Negative for dizziness and light-headedness.  ? ?PHYSICAL EXAM: ? ?  05/10/2022  ?  2:09 PM 05/10/2022  ?  2:07 PM 01/18/2022  ? 11:31 AM  ?Vitals with BMI  ?Height  5\' 3"  5\' 3"   ?Weight  277 lbs 285 lbs 13 oz  ?BMI  49.08 50.64  ?Systolic [id number]  ?Diastolic 98 0000000 84  ?Pulse 79 80 71  ? ?CONSTITUTIONAL: Well-developed and well-nourished. No acute distress.  ?SKIN: Skin is warm and dry. No rash noted. No cyanosis. No pallor. No jaundice ?HEAD: Normocephalic and atraumatic.  ?EYES: No scleral icterus ?MOUTH/THROAT: Moist oral membranes.  ?NECK: No JVD present. No thyromegaly noted. No carotid bruits  ?LYMPHATIC: No visible cervical adenopathy.  ?CHEST Normal respiratory effort. No intercostal retractions  ?LUNGS: Clear to auscultation bilaterally.  No stridor. No wheezes. No rales.  ?CARDIOVASCULAR: Regular rate and rhythm, positive S1-S2, no murmurs rubs or gallops appreciated. ?ABDOMINAL: Obese, soft, nontender, nondistended, positive bowel sounds in all 4 quadrants, no apparent ascites.  ?EXTREMITIES: No peripheral edema, warm to touch, 2+ dorsalis pedis and posterior tibial pulses. ?HEMATOLOGIC: No significant bruising ?NEUROLOGIC: Oriented to person, place, and time. Nonfocal. Normal muscle tone.  ?PSYCHIATRIC: Normal mood and affect. Normal behavior. Cooperative ?No change is physical examination since the last visit.  ? ?CARDIAC DATABASE: ?EKG: ?01/18/2022: NSR, 69 bpm, without underlying injury pattern. ? ?Echocardiogram: ?08/20/2012: LVEF 45-50%, diffuse hypokinesis, grade 3 diastolic dysfunction mild AR, mildly dilated left atrium, RVSP 31 mmHg. ? ?12/20/2020:  ?Low normal LV systolic function with EF 53%. Left ventricle cavity is normal in size. Moderate concentric remodeling of  the left ventricle. Normal global wall motion. Doppler evidence of grade II (pseudonormal) diastolic dysfunction, elevated LAP.  ?Trileaflet aortic valve. Mild (Grade I) aortic regurgitation. ?No significant change from the report of 08/20/2012. ?  ?Stress Testing: ?Lexiscan Sestamibi stress test 12/14/2020: ?Stress EKG is non-diagnostic, as this is pharmacological stress test using Lexiscan. ?Patient was scheduled for Exercise nuclear stress test; however, she did not reach 85% APMHR so transitioned to [place].  ?No apparent territory of reversible ischemia, with soft and breast tissue attenuation artifact (BMI 50.2). ?Left ventricular size is dilated (EDV 137 cc).  ?Left ventricular wall thickness is preserved without regional motion abnormalities. LVEF 58%. ?No prior study for comparison.  ?Low risk study.  ? ?Heart Catheterization: ?None ? ?LABORATORY DATA: ? ?  Latest Ref Rng & Units 09/22/2014  ? 11:30 AM 08/19/2012  ?  5:19 PM 08/19/2012  ? 11:53 AM  ?CBC  ?WBC 4.0 - 10.5 K/uL 6.0   8.7   8.3    ?Hemoglobin 12.0 - 15.0 g/dL 15.0   14.1   14.2    ?Hematocrit 36.0 - 46.0 % 43.7   41.2   46.6    ?Platelets 150 - 400 K/uL 353   335     ? ? ? ?  Latest Ref Rng & Units 02/05/2022  ?  8:14 AM 01/22/2022  ?  8:01 AM 03/31/2021  ?  4:12 PM  ?CMP  ?Glucose 70 - 99 mg/dL 101   97   92    ?BUN 6 - 20 mg/dL 14   13   16     ?Creatinine 0.57 - 1.00 mg/dL 1.37   1.36   1.32    ?Sodium 134 - 144 mmol/L 140   138   139    ?Potassium 3.5 - 5.2 mmol/L 4.5   5.0   3.9    ?Chloride 96 - 106 mmol/L 102   100   103    ?CO2 20 - 29 mmol/L 23   24   20     ?Calcium 8.7 - 10.2 mg/dL 9.7   10.0   9.3    ?Total Protein 6.0 - 8.5 g/dL  7.7     ?Total Bilirubin 0.0 - 1.2 mg/dL  0.6     ?Alkaline Phos 44 - 121 IU/L  59     ?AST 0 - 40 IU/L  14     ?ALT 0 - 32 IU/L  25     ? ? ?Lipid Panel  ?Lab Results  ?Component Value Date  ? CHOL 173 01/22/2022  ? HDL 35 (L) 01/22/2022  ? LDLCALC 105 (H) 01/22/2022  ? LDLDIRECT 90 01/22/2022  ? TRIG 186 (H)  01/22/2022  ? CHOLHDL 6.9 (H) 07/13/2020  ? ?No components found for: NTPROBNP ?Recent Labs  ?  01/22/22 ?0801 02/05/22 ?0813  ?PROBNP 96 269*  ? ?No results for input(s): TSH in the last 8760 hours. ? ?BMP ?Re

## 2022-05-11 ENCOUNTER — Telehealth: Payer: Self-pay

## 2022-05-11 ENCOUNTER — Other Ambulatory Visit (HOSPITAL_COMMUNITY): Payer: Self-pay

## 2022-05-11 NOTE — Telephone Encounter (Signed)
Pt called back to update medication list per your request. List is accurate and up to date. ?

## 2022-05-14 ENCOUNTER — Telehealth: Payer: Self-pay

## 2022-05-14 ENCOUNTER — Other Ambulatory Visit: Payer: Self-pay

## 2022-05-14 DIAGNOSIS — I10 Essential (primary) hypertension: Secondary | ICD-10-CM

## 2022-05-14 DIAGNOSIS — I5042 Chronic combined systolic (congestive) and diastolic (congestive) heart failure: Secondary | ICD-10-CM

## 2022-05-14 NOTE — Telephone Encounter (Signed)
So she is not taking losartan/hydrochlorothiazide correct?  ?If so please remove that from her MAR. ? ?Robin Minardi Odis Hollingshead, DO, [facility]

## 2022-05-14 NOTE — Telephone Encounter (Signed)
Pt is not taking the losartan/hydrochlorothiazide. Updated MAR.

## 2022-05-14 NOTE — Telephone Encounter (Signed)
Pt experiencing side effects from Isosorbide. Please call to discuss.//ah ?

## 2022-05-14 NOTE — Telephone Encounter (Signed)
Pt called and stated that she is getting terrible headaches since starting the Isosorbide. Headaches started Saturday 05/12/2022 and they have not gone away. Her Bp while taking this medication is 174/69. She is unsure of what to do, please advise.

## 2022-05-23 ENCOUNTER — Other Ambulatory Visit (HOSPITAL_COMMUNITY): Payer: Self-pay

## 2022-05-23 MED ORDER — HYDRALAZINE HCL 50 MG PO TABS
50.0000 mg | ORAL_TABLET | Freq: Three times a day (TID) | ORAL | 0 refills | Status: DC
  Filled 2022-05-23: qty 270, 90d supply, fill #0

## 2022-05-23 NOTE — Telephone Encounter (Signed)
CARDIOLOGY TELEPHONE ENCOUNTER 05/23/22  Patient's name: Robin May.   MRN: [id number].    DOB: [birth year] Primary care provider: Towanda Octave, MD. Primary cardiologist: Tessa Lerner, DO, [facility]  Interaction regarding this patient's care today: Patient called the office at her blood pressure still not well controlled.   Patient states her usual blood pressures are 172/80 mmHg.  She is tolerating hydralazine well without any side effects or intolerances.  But with Isordil she is having headaches.  She was prescribed 20 mg p.o. 3 times daily but currently takes 10 mg p.o. 3 times daily.  She has an appointment with sleep medicine to evaluate for sleep apnea on May 31, 2022.  Impression:   ICD-10-CM   1. Chronic combined systolic and diastolic heart failure (HCC)  B71.69 hydrALAZINE (APRESOLINE) 50 MG tablet    2. Essential hypertension  I10 hydrALAZINE (APRESOLINE) 50 MG tablet      Meds ordered this encounter  Medications   hydrALAZINE (APRESOLINE) 50 MG tablet    Sig: Take 1 tablet (50 mg total) by mouth 3 (three) times daily.    Dispense:  270 tablet    Refill:  0    No orders of the defined types were placed in this encounter.   Recommendations: We will uptitrate hydralazine 37.5 mg p.o. 3 times daily to hydralazine 50 mg p.o. 3 times daily. Continue Isordil 10 mg p.o. 3 times daily for now if able to tolerate. We will await the results of the sleep evaluation. I have asked her to call the office back in 1 week with her blood pressure readings. Reemphasized importance of low-salt diet.  Telephone encounter total time: 9 minutes  [facility]  Pager: [phone] Office: [phone]

## 2022-05-30 ENCOUNTER — Other Ambulatory Visit: Payer: Self-pay | Admitting: Cardiology

## 2022-05-30 ENCOUNTER — Other Ambulatory Visit (HOSPITAL_COMMUNITY): Payer: Self-pay

## 2022-05-30 DIAGNOSIS — I5032 Chronic diastolic (congestive) heart failure: Secondary | ICD-10-CM

## 2022-05-30 MED ORDER — DAPAGLIFLOZIN PROPANEDIOL 10 MG PO TABS
10.0000 mg | ORAL_TABLET | Freq: Every day | ORAL | 0 refills | Status: AC
  Filled 2022-05-30: qty 90, 90d supply, fill #0

## 2022-06-05 ENCOUNTER — Encounter: Payer: Self-pay | Admitting: *Deleted

## 2022-08-06 ENCOUNTER — Other Ambulatory Visit (HOSPITAL_COMMUNITY): Payer: Self-pay

## 2022-08-06 ENCOUNTER — Other Ambulatory Visit: Payer: Self-pay | Admitting: Family Medicine

## 2022-08-06 DIAGNOSIS — E782 Mixed hyperlipidemia: Secondary | ICD-10-CM

## 2022-08-06 MED ORDER — ATORVASTATIN CALCIUM 40 MG PO TABS
ORAL_TABLET | Freq: Every day | ORAL | 3 refills | Status: DC
  Filled 2022-08-06: qty 90, 90d supply, fill #0
  Filled 2022-09-04 – 2022-12-04 (×2): qty 90, 90d supply, fill #1
  Filled 2023-02-09 – 2023-04-01 (×2): qty 90, 90d supply, fill #2
  Filled 2023-07-27: qty 90, 90d supply, fill #3

## 2022-08-16 ENCOUNTER — Ambulatory Visit: Payer: Commercial Managed Care - HMO | Admitting: Cardiology

## 2022-09-04 ENCOUNTER — Other Ambulatory Visit: Payer: Self-pay | Admitting: Cardiology

## 2022-09-04 ENCOUNTER — Other Ambulatory Visit (HOSPITAL_COMMUNITY): Payer: Self-pay

## 2022-09-04 ENCOUNTER — Other Ambulatory Visit: Payer: Self-pay

## 2022-09-04 DIAGNOSIS — I5042 Chronic combined systolic (congestive) and diastolic (congestive) heart failure: Secondary | ICD-10-CM

## 2022-09-04 DIAGNOSIS — I10 Essential (primary) hypertension: Secondary | ICD-10-CM

## 2022-09-04 MED ORDER — ENTRESTO 97-103 MG PO TABS
1.0000 | ORAL_TABLET | Freq: Two times a day (BID) | ORAL | 0 refills | Status: DC
Start: 2022-09-04 — End: 2023-05-19
  Filled 2022-09-04: qty 180, 90d supply, fill #0

## 2022-09-04 MED ORDER — HYDRALAZINE HCL 50 MG PO TABS
50.0000 mg | ORAL_TABLET | Freq: Three times a day (TID) | ORAL | 0 refills | Status: DC
  Filled 2022-09-04: qty 270, 90d supply, fill #0

## 2022-09-07 ENCOUNTER — Other Ambulatory Visit: Payer: Self-pay | Admitting: Cardiology

## 2022-09-07 ENCOUNTER — Other Ambulatory Visit (HOSPITAL_COMMUNITY): Payer: Self-pay

## 2022-09-07 DIAGNOSIS — I10 Essential (primary) hypertension: Secondary | ICD-10-CM

## 2022-09-07 DIAGNOSIS — I5042 Chronic combined systolic (congestive) and diastolic (congestive) heart failure: Secondary | ICD-10-CM

## 2022-09-11 ENCOUNTER — Other Ambulatory Visit (HOSPITAL_COMMUNITY): Payer: Self-pay

## 2022-09-11 MED ORDER — ISOSORBIDE DINITRATE 20 MG PO TABS
20.0000 mg | ORAL_TABLET | Freq: Three times a day (TID) | ORAL | 2 refills | Status: DC
  Filled 2022-09-11: qty 90, 30d supply, fill #0
  Filled 2023-02-09: qty 90, 30d supply, fill #1

## 2022-09-12 ENCOUNTER — Other Ambulatory Visit (HOSPITAL_COMMUNITY): Payer: Self-pay

## 2022-09-13 ENCOUNTER — Other Ambulatory Visit (HOSPITAL_COMMUNITY): Payer: Self-pay

## 2022-09-13 ENCOUNTER — Ambulatory Visit: Payer: Commercial Managed Care - HMO | Admitting: Cardiology

## 2022-09-13 ENCOUNTER — Encounter: Payer: Self-pay | Admitting: Cardiology

## 2022-09-13 VITALS — BP 176/92 | HR 95 | Temp 97.0°F | Resp 16 | Ht 63.0 in | Wt 269.0 lb

## 2022-09-13 DIAGNOSIS — I10 Essential (primary) hypertension: Secondary | ICD-10-CM

## 2022-09-13 DIAGNOSIS — I5042 Chronic combined systolic (congestive) and diastolic (congestive) heart failure: Secondary | ICD-10-CM

## 2022-09-13 DIAGNOSIS — Z87891 Personal history of nicotine dependence: Secondary | ICD-10-CM

## 2022-09-13 DIAGNOSIS — E782 Mixed hyperlipidemia: Secondary | ICD-10-CM

## 2022-09-13 DIAGNOSIS — E66813 Obesity, class 3: Secondary | ICD-10-CM

## 2022-09-13 MED ORDER — HYDRALAZINE HCL 100 MG PO TABS
100.0000 mg | ORAL_TABLET | Freq: Three times a day (TID) | ORAL | 0 refills | Status: DC
  Filled 2022-09-13 – 2023-02-09 (×2): qty 270, 90d supply, fill #0

## 2022-09-13 NOTE — Progress Notes (Signed)
Date:  09/13/2022   ID:  Robin May, DOB 1986/10/03, MRN [id number]  PCP:  Orvis Brill, DO  Cardiologist:  Rex Kras, DO, [facility] (established care 12/09/2020) Former Cardiology Providers: Dr. Adrian Prows  Date: 09/13/22 Last Office Visit: 05/10/2022  Chief Complaint  Patient presents with   heart failure management   Follow-up    HPI  Robin May is a 36 y.o. female whose past medical history and cardiovascular risk factors include: Hypertension, obesity due to excess calories (Body mass index is 47.65 kg/m.), former smoker, chronic systolic and diastolic heart failure.  Patient is being followed by the practice for combined systolic and diastolic heart failure.  In 2013 she was noted to have an LVEF of 45-50% and grade 3 diastolic impairment.  With up titration of medical therapy her LVEF has improved to approximately 53% and grade 2 diastolic impairment as of December 2021.  At the last office visit her blood pressures were not well controlled and therefore medications were uptitrated.  She did not respond well to Isordil as it was causing her to have headaches and therefore the dose was reduced and hydralazine was increased to 50 mg p.o. 3 times daily.  Patient states that her home blood pressures are 140 mmHg.  She has also been evaluated by sleep medicine and has severe sleep apnea and is in the process of obtaining a device.  Clinically she denies anginal discomfort or heart failure symptoms.  No hospitalizations or urgent care visits since last office encounter.  FUNCTIONAL STATUS: Walks approximately 40 minutes every day.   ALLERGIES: No Known Allergies  MEDICATION LIST PRIOR TO VISIT: Current Meds  Medication Sig   atorvastatin (LIPITOR) 40 MG tablet TAKE 1 TABLET BY MOUTH DAILY.   carvedilol (COREG) 25 MG tablet Take 1 and 1/2 tablets by mouth twice daily with meals   dapagliflozin propanediol (FARXIGA) 10 MG TABS tablet Take 10 mg by mouth daily.    isosorbide dinitrate (ISORDIL) 20 MG tablet Take 1 tablet by mouth 3 times daily.   sacubitril-valsartan (ENTRESTO) 97-103 MG Take 1 tablet by mouth 2 (two) times daily.   sodium chloride (OCEAN) 0.65 % SOLN nasal spray Place 1 spray into both nostrils as needed for congestion.   spironolactone (ALDACTONE) 50 MG tablet Take 50 mg by mouth daily.   [DISCONTINUED] hydrALAZINE (APRESOLINE) 50 MG tablet Take 1 tablet (50 mg total) by mouth 3 (three) times daily.     PAST MEDICAL HISTORY: Past Medical History:  Diagnosis Date   Allergy    Anginal pain ([place]) 08/18/2012   "mild"   CHF (congestive heart failure) ([place])    Heart failure ([place]) 2016   Hyperlipidemia    Hypertension    Shortness of breath 08/19/2012   "w/exertion and w/lying down"    PAST SURGICAL HISTORY: Past Surgical History:  Procedure Laterality Date   NO PAST SURGERIES      FAMILY HISTORY: The patient family history includes Asthma in her brother and sister; Diabetes in her father; Heart disease in her maternal grandfather; Hypertension in her maternal grandfather, maternal grandmother, mother, paternal grandfather, and paternal grandmother; Kidney failure in her maternal grandfather; Thyroid disease in her mother.  SOCIAL HISTORY:  The patient  reports that she quit smoking about 16 years ago. Her smoking use included cigars. She has never used smokeless tobacco. She reports that she does not drink alcohol and does not use drugs.  REVIEW OF SYSTEMS: Review of Systems  Constitutional: Negative for  chills and fever.  HENT:  Negative for hoarse voice and nosebleeds.   Eyes:  Negative for discharge, double vision and pain.  Cardiovascular:  Negative for chest pain, claudication, dyspnea on exertion, leg swelling, near-syncope, orthopnea, palpitations, paroxysmal nocturnal dyspnea and syncope.  Respiratory:  Negative for hemoptysis and shortness of breath.   Musculoskeletal:  Negative for muscle cramps and myalgias.   Gastrointestinal:  Negative for abdominal pain, constipation, diarrhea, hematemesis, hematochezia, melena, nausea and vomiting.  Neurological:  Negative for dizziness and light-headedness.    PHYSICAL EXAM:    09/13/2022    9:08 AM 09/13/2022    9:05 AM 05/10/2022    2:09 PM  Vitals with BMI  Height  5\' 3"    Weight  269 lbs   BMI  47.66   Systolic 176 153  Diastolic 92 91 98  Pulse 95 103 79   Physical Exam  Constitutional: No distress.  Age appropriate, hemodynamically stable.   Neck: No JVD present.  Cardiovascular: Normal rate, regular rhythm, S1 normal, S2 normal, intact distal pulses and normal pulses. Exam reveals no gallop, no S3 and no S4.  No murmur heard. Pulmonary/Chest: Effort normal and breath sounds normal. No stridor. She has no wheezes. She has no rales.  Abdominal: Soft. Bowel sounds are normal. She exhibits no distension. There is no abdominal tenderness.  Musculoskeletal:        General: No edema.     Cervical back: Neck supple.  Neurological: She is alert and oriented to person, place, and time. She has intact cranial nerves (2-12).  Skin: Skin is warm and moist.     CARDIAC DATABASE: EKG: 01/18/2022: NSR, 69 bpm, without underlying injury pattern.  Echocardiogram: 08/20/2012: LVEF 45-50%, diffuse hypokinesis, grade 3 diastolic dysfunction mild AR, mildly dilated left atrium, RVSP 31 mmHg.  12/20/2020:  Low normal LV systolic function with EF 53%. Left ventricle cavity is normal in size. Moderate concentric remodeling of the left ventricle. Normal global wall motion. Doppler evidence of grade II (pseudonormal) diastolic dysfunction, elevated LAP.  Trileaflet aortic valve. Mild (Grade I) aortic regurgitation. No significant change from the report of 08/20/2012.   Stress Testing: Lexiscan Sestamibi stress test 12/14/2020: Stress EKG is non-diagnostic, as this is pharmacological stress test using Lexiscan. Patient was scheduled for Exercise nuclear  stress test; however, she did not reach 85% APMHR so transitioned to [place].  No apparent territory of reversible ischemia, with soft and breast tissue attenuation artifact (BMI 50.2). Left ventricular size is dilated (EDV 137 cc).  Left ventricular wall thickness is preserved without regional motion abnormalities. LVEF 58%. No prior study for comparison.  Low risk study.   Heart Catheterization: None  LABORATORY DATA:    Latest Ref Rng & Units 09/22/2014   11:30 AM 08/19/2012    5:19 PM 08/19/2012   11:53 AM  CBC  WBC 4.0 - 10.5 K/uL 6.0  8.7  8.3   Hemoglobin 12.0 - 15.0 g/dL 08/21/2012  41.7  40.8   Hematocrit 36.0 - 46.0 % 43.7  41.2  46.6   Platelets 150 - 400 K/uL 353  335         Latest Ref Rng & Units 02/05/2022    8:14 AM 01/22/2022    8:01 AM 03/31/2021    4:12 PM  CMP  Glucose 70 - 99 mg/dL 05/31/2021  97  92   BUN 6 - 20 mg/dL 14  13  16    Creatinine 0.57 - 1.00 mg/dL 818   1.32   Sodium 134 - 144 mmol/L 140  138  139   Potassium 3.5 - 5.2 mmol/L 4.5  5.0  3.9   Chloride 96 - 106 mmol/L 102  100  103   CO2 20 - 29 mmol/L 23  24  20    Calcium 8.7 - 10.2 mg/dL 9.7   9.3   Total Protein 6.0 - 8.5 g/dL  7.7    Total Bilirubin 0.0 - 1.2 mg/dL  0.6    Alkaline Phos 44 - 121 IU/L  59    AST 0 - 40 IU/L  14    ALT 0 - 32 IU/L  25      Lipid Panel  Lab Results  Component Value Date   CHOL 173 01/22/2022   HDL 35 (L) 01/22/2022   LDLCALC 105 (H) 01/22/2022   LDLDIRECT 90 01/22/2022   TRIG 186 (H) 01/22/2022   CHOLHDL 6.9 (H) 07/13/2020   No components found for: "NTPROBNP" Recent Labs    01/22/22 0801 02/05/22 0813  PROBNP 96 269*   No results for input(s): "TSH" in the last 8760 hours.  BMP Recent Labs    01/22/22 0801 02/05/22 0814  NA 138 140  K 5.0 4.5  CL 100 102  CO2 24 23  GLUCOSE 97 101*  BUN 13 14  CREATININE 1.36* 1.37*  CALCIUM 10.0 9.7    HEMOGLOBIN A1C Lab Results  Component Value Date   HGBA1C 5.5 08/19/2012   MPG 111  08/19/2012    IMPRESSION:    ICD-10-CM   1. Chronic combined systolic and diastolic heart failure (HCC)  08/21/2012 hydrALAZINE (APRESOLINE) 100 MG tablet    PCV ECHOCARDIOGRAM COMPLETE    2. Essential hypertension  I10 hydrALAZINE (APRESOLINE) 100 MG tablet    3. Mixed hyperlipidemia  E78.2     4. Former smoker  Z87.891     5. Class 3 severe obesity due to excess calories with serious comorbidity and body mass index (BMI) of 45.0 to 49.9 in adult [facility])  E66.01    Z68.42        RECOMMENDATIONS: Robin May is a 36 y.o. female whose past medical history and cardiac risk factors include: Hypertension, obesity due to excess calories (BMI 50), former smoker, chronic systolic and diastolic heart failure.  Chronic combined systolic and diastolic heart failure (HCC) Compensated, NYHA class II/III Diagnosed in 2013.  Medications reconciled Increase hydralazine from 50 mg p.o. 3 times daily to 100 mg p.o. 3 times daily. Reemphasized importance of getting the device to address her sleep apnea. We will refer her to [facility] management clinic to begin her weight loss journey. Reemphasized importance of a low-salt diet. Plan echo prior to the next office visit.  Essential hypertension Not well controlled. Address her underlying sleep apnea. Recommend a goal SBP between 120-130 mmHg. Patient is asked to keep a log of her blood pressures and to give [facility] a copy in 1 to 2 weeks of her readings in an ambulatory setting.  If further medication titration is warranted we will transition him from carvedilol to labetalol. Up titration of medical therapy as discussed above  Mixed hyperlipidemia Currently on atorvastatin.   She denies myalgia or other side effects. Patient states that she has an upcoming office visit with PCP in 2 weeks and will have her lipids rechecked.  Former smoker We emphasized the importance of complete smoking cessation  Class 3 severe obesity due to excess  calories with serious comorbidity  and body mass index (BMI) of 45.0 to 49.9 in adult [facility]) Body mass index is 47.65 kg/m. I reviewed with the patient the importance of diet, regular physical activity/exercise, weight loss.   Patient is educated on increasing physical activity gradually as tolerated.  With the goal of moderate intensity exercise for 30 minutes a day 5 days a week. We will refer [facility] to [facility]  FINAL MEDICATION LIST END OF ENCOUNTER: Meds ordered this encounter  Medications   hydrALAZINE (APRESOLINE) 100 MG tablet    Sig: Take 1 tablet (100 mg total) by mouth 3 (three) times daily.    Dispense:  270 tablet    Refill:  0    Medications Discontinued During This Encounter  Medication Reason   hydrALAZINE (APRESOLINE) 50 MG tablet       Current Outpatient Medications:    atorvastatin (LIPITOR) 40 MG tablet, TAKE 1 TABLET BY MOUTH DAILY., Disp: 90 tablet, Rfl: 3   carvedilol (COREG) 25 MG tablet, Take 1 and 1/2 tablets by mouth twice daily with meals, Disp: 90 tablet, Rfl: 0   dapagliflozin propanediol (FARXIGA) 10 MG TABS tablet, Take 10 mg by mouth daily., Disp: , Rfl:    isosorbide dinitrate (ISORDIL) 20 MG tablet, Take 1 tablet by mouth 3 times daily., Disp: 90 tablet, Rfl: 2   sacubitril-valsartan (ENTRESTO) 97-103 MG, Take 1 tablet by mouth 2 (two) times daily., Disp: 180 tablet, Rfl: 0   sodium chloride (OCEAN) 0.65 % SOLN nasal spray, Place 1 spray into both nostrils as needed for congestion., Disp: 44 mL, Rfl: 0   spironolactone (ALDACTONE) 50 MG tablet, Take 50 mg by mouth daily., Disp: , Rfl:    hydrALAZINE (APRESOLINE) 100 MG tablet, Take 1 tablet (100 mg total) by mouth 3 (three) times daily., Disp: 270 tablet, Rfl: 0  Orders Placed This Encounter  Procedures   PCV ECHOCARDIOGRAM COMPLETE    There are no Patient Instructions on file for this visit.   --Continue cardiac medications as reconciled in final medication list. --Return in  about 6 months (around 03/14/2023) for Follow up, heart failure management.. Or sooner if needed. --Continue follow-up with your primary care physician regarding the management of your other chronic comorbid conditions.  Patient's questions and concerns were addressed to her satisfaction. She voices understanding of the instructions provided during this encounter.   This note was created using a voice recognition software as a result there may be grammatical errors inadvertently enclosed that do not reflect the nature of this encounter. Every attempt is made to correct such errors.  Rex Kras, [place], [facility]  Pager: [phone] Office: [phone]

## 2022-09-14 ENCOUNTER — Other Ambulatory Visit (HOSPITAL_COMMUNITY): Payer: Self-pay

## 2022-09-24 ENCOUNTER — Other Ambulatory Visit: Payer: Self-pay | Admitting: Family Medicine

## 2022-09-24 ENCOUNTER — Other Ambulatory Visit (HOSPITAL_COMMUNITY): Payer: Self-pay

## 2022-09-24 DIAGNOSIS — I5032 Chronic diastolic (congestive) heart failure: Secondary | ICD-10-CM

## 2022-09-25 MED ORDER — CARVEDILOL 25 MG PO TABS
ORAL_TABLET | ORAL | 0 refills | Status: DC
  Filled 2022-09-25: qty 90, 30d supply, fill #0

## 2022-09-26 ENCOUNTER — Other Ambulatory Visit (HOSPITAL_COMMUNITY): Payer: Self-pay

## 2022-09-29 ENCOUNTER — Other Ambulatory Visit (HOSPITAL_COMMUNITY): Payer: Self-pay

## 2022-10-18 ENCOUNTER — Other Ambulatory Visit (HOSPITAL_COMMUNITY): Payer: Self-pay

## 2022-10-18 ENCOUNTER — Other Ambulatory Visit: Payer: Self-pay | Admitting: Student

## 2022-10-18 MED ORDER — SPIRONOLACTONE 50 MG PO TABS
50.0000 mg | ORAL_TABLET | Freq: Every day | ORAL | 3 refills | Status: DC
Start: 2022-10-18 — End: 2023-07-01
  Filled 2022-10-18: qty 30, 30d supply, fill #0
  Filled 2022-12-04: qty 30, 30d supply, fill #1
  Filled 2023-02-09: qty 30, 30d supply, fill #2
  Filled 2023-04-01: qty 30, 30d supply, fill #3

## 2022-10-18 MED ORDER — DAPAGLIFLOZIN PROPANEDIOL 10 MG PO TABS
10.0000 mg | ORAL_TABLET | Freq: Every day | ORAL | 3 refills | Status: DC
  Filled 2022-10-18: qty 30, 30d supply, fill #0
  Filled 2022-12-04: qty 30, 30d supply, fill #1
  Filled 2023-02-09: qty 30, 30d supply, fill #2
  Filled 2023-04-01: qty 30, 30d supply, fill #3

## 2022-10-19 ENCOUNTER — Other Ambulatory Visit (HOSPITAL_COMMUNITY): Payer: Self-pay

## 2022-10-20 ENCOUNTER — Other Ambulatory Visit (HOSPITAL_COMMUNITY): Payer: Self-pay

## 2022-10-29 ENCOUNTER — Encounter (INDEPENDENT_AMBULATORY_CARE_PROVIDER_SITE_OTHER): Payer: Self-pay

## 2022-12-04 ENCOUNTER — Other Ambulatory Visit: Payer: Self-pay | Admitting: Student

## 2022-12-04 DIAGNOSIS — I5032 Chronic diastolic (congestive) heart failure: Secondary | ICD-10-CM

## 2022-12-05 ENCOUNTER — Other Ambulatory Visit (HOSPITAL_COMMUNITY): Payer: Self-pay

## 2022-12-05 MED ORDER — CARVEDILOL 25 MG PO TABS
37.5000 mg | ORAL_TABLET | Freq: Two times a day (BID) | ORAL | 0 refills | Status: DC
  Filled 2022-12-05: qty 90, 30d supply, fill #0

## 2023-02-09 ENCOUNTER — Other Ambulatory Visit (HOSPITAL_COMMUNITY): Payer: Self-pay

## 2023-02-09 ENCOUNTER — Other Ambulatory Visit: Payer: Self-pay | Admitting: Student

## 2023-02-09 ENCOUNTER — Encounter: Payer: Self-pay | Admitting: Student

## 2023-02-09 DIAGNOSIS — I5032 Chronic diastolic (congestive) heart failure: Secondary | ICD-10-CM

## 2023-02-11 ENCOUNTER — Other Ambulatory Visit: Payer: Self-pay

## 2023-02-11 ENCOUNTER — Other Ambulatory Visit (HOSPITAL_COMMUNITY): Payer: Self-pay

## 2023-02-11 MED ORDER — CARVEDILOL 25 MG PO TABS
37.5000 mg | ORAL_TABLET | Freq: Two times a day (BID) | ORAL | 3 refills | Status: DC
  Filled 2023-02-11: qty 90, 30d supply, fill #0
  Filled 2023-04-01: qty 90, 30d supply, fill #1
  Filled 2023-07-27: qty 90, 30d supply, fill #2
  Filled 2023-08-29 – 2023-08-31 (×2): qty 90, 30d supply, fill #3

## 2023-03-14 ENCOUNTER — Ambulatory Visit: Payer: Commercial Managed Care - HMO

## 2023-03-14 DIAGNOSIS — I5042 Chronic combined systolic (congestive) and diastolic (congestive) heart failure: Secondary | ICD-10-CM

## 2023-03-21 ENCOUNTER — Ambulatory Visit: Payer: Commercial Managed Care - HMO | Admitting: Cardiology

## 2023-04-02 ENCOUNTER — Other Ambulatory Visit (HOSPITAL_COMMUNITY): Payer: Self-pay

## 2023-04-22 ENCOUNTER — Ambulatory Visit: Payer: Commercial Managed Care - HMO | Admitting: Cardiology

## 2023-04-22 ENCOUNTER — Encounter: Payer: Self-pay | Admitting: Cardiology

## 2023-04-22 VITALS — BP 145/87 | HR 83 | Resp 18 | Ht 63.0 in | Wt 268.8 lb

## 2023-04-22 DIAGNOSIS — E782 Mixed hyperlipidemia: Secondary | ICD-10-CM

## 2023-04-22 DIAGNOSIS — I5032 Chronic diastolic (congestive) heart failure: Secondary | ICD-10-CM

## 2023-04-22 DIAGNOSIS — Z87891 Personal history of nicotine dependence: Secondary | ICD-10-CM

## 2023-04-22 DIAGNOSIS — I10 Essential (primary) hypertension: Secondary | ICD-10-CM

## 2023-04-22 NOTE — Progress Notes (Signed)
ID:  KHRISTAL BAYDOUN, DOB 1986-12-10, MRN [id number]  PCP:  Darral Dash, DO  Cardiologist:  Tessa Lerner, DO, [facility] (established care 12/09/2020) Former Cardiology Providers: Dr. Yates Decamp  Date: 04/22/23 Last Office Visit: 09/13/2022  Chief Complaint  Patient presents with   Congestive Heart Failure   Follow-up    6 months    HPI  Robin May is a 37 y.o. female whose past medical history and cardiovascular risk factors include: chronic HFpEF, sleep apnea, hypertension, obesity due to excess calories (Body mass index is 47.62 kg/m.), former smoker.  Patient is being followed by the practice given her history of systolic and diastolic heart failure.  In 2013 she was noted to have an LVEF of 45-50% with grade 3 diastolic impairment.  With uptitration of medical therapy her LVEF is improved.  At last office visit her medical therapy was uptitrated was recommended to have an echo prior to today's office visit.  Results of the echocardiogram from March 2024 independently reviewed with her at today's office visit.  Since last office visit, patient is doing well from a cardiovascular standpoint.  She denies anginal chest pain or heart failure symptoms.  Patient states that her home blood pressures are well-controlled, SBP ranges between 120-130 mmHg and diastolic blood pressures around 70 mmHg on current medical regimen.  Since last visit she has been evaluated by sleep medicine and has a diagnosis of sleep apnea but has been awaiting for her insurance company to get a device.  It has been 2 months since she has reached out to them.  She also reached out to [facility] health and wellness center to help facilitate her weight loss journey.  However, due to work constraints she is unable to see them every 2 weeks.   To the best of her knowledge no family history of premature cardiomyopathy or sudden cardiac death.  Maternal grandfather did have diagnosis of congestive heart failure but age  of onset is unknown to the patient.   FUNCTIONAL STATUS: Walks approximately 40 minutes every day.   ALLERGIES: No Known Allergies  MEDICATION LIST PRIOR TO VISIT: Current Meds  Medication Sig   atorvastatin (LIPITOR) 40 MG tablet TAKE 1 TABLET BY MOUTH DAILY.   carvedilol (COREG) 25 MG tablet Take 1 and 1/2 tablets (37.5 mg) by mouth 2 times daily with a meal.   dapagliflozin propanediol (FARXIGA) 10 MG TABS tablet Take 1 tablet (10 mg total) by mouth daily.   hydrALAZINE (APRESOLINE) 100 MG tablet Take 1 tablet (100 mg total) by mouth 3 (three) times daily.   isosorbide dinitrate (ISORDIL) 20 MG tablet Take 1 tablet by mouth 3 times daily.   sacubitril-valsartan (ENTRESTO) 97-103 MG Take 1 tablet by mouth 2 (two) times daily.   sodium chloride (OCEAN) 0.65 % SOLN nasal spray Place 1 spray into both nostrils as needed for congestion.   spironolactone (ALDACTONE) 50 MG tablet Take 1 tablet (50 mg total) by mouth daily.     PAST MEDICAL HISTORY: Past Medical History:  Diagnosis Date   Allergy    Anginal pain (HCC) 08/18/2012   "mild"   CHF (congestive heart failure) (HCC)    Heart failure (HCC) 2016   Hyperlipidemia    Hypertension    Shortness of breath 08/19/2012   "w/exertion and w/lying down"    PAST SURGICAL HISTORY: Past Surgical History:  Procedure Laterality Date   NO PAST SURGERIES      FAMILY HISTORY: The patient family history includes  Asthma in her brother and sister; Diabetes in her father; Heart disease in her maternal grandfather; Heart failure in her sister; Hypertension in her maternal grandfather, maternal grandmother, mother, paternal grandfather, and paternal grandmother; Kidney failure in her maternal grandfather; Thyroid disease in her mother.  SOCIAL HISTORY:  The patient  reports that she quit smoking about 17 years ago. Her smoking use included cigars. She has never used smokeless tobacco. She reports that she does not drink alcohol and does not use  drugs.  REVIEW OF SYSTEMS: Review of Systems  Cardiovascular:  Negative for chest pain, claudication, dyspnea on exertion, irregular heartbeat, leg swelling, near-syncope, orthopnea, palpitations, paroxysmal nocturnal dyspnea and syncope.  Respiratory:  Negative for shortness of breath.   Hematologic/Lymphatic: Negative for bleeding problem.  Musculoskeletal:  Negative for muscle cramps and myalgias.  Neurological:  Negative for dizziness and light-headedness.    PHYSICAL EXAM:    04/22/2023    2:26 PM 09/13/2022    9:08 AM 09/13/2022    9:05 AM  Vitals with BMI  Height 5\' 3"   5\' 3"   Weight 268 lbs 13 oz  269 lbs  BMI 47.63  47.66  Systolic 145 176 161  Diastolic 87 92 91  Pulse 83 95 103   Physical Exam  Constitutional: No distress.  Age appropriate, hemodynamically stable.   Neck: No JVD present.  Cardiovascular: Normal rate, regular rhythm, S1 normal, S2 normal, intact distal pulses and normal pulses. Exam reveals no gallop, no S3 and no S4.  No murmur heard. Pulmonary/Chest: Effort normal and breath sounds normal. No stridor. She has no wheezes. She has no rales.  Abdominal: Soft. Bowel sounds are normal. She exhibits no distension. There is no abdominal tenderness.  Musculoskeletal:        General: No edema.     Cervical back: Neck supple.  Neurological: She is alert and oriented to person, place, and time. She has intact cranial nerves (2-12).  Skin: Skin is warm and moist.     CARDIAC DATABASE: EKG: April 22, 2023: Sinus rhythm, 80 bpm, poor R wave progression, without underlying injury pattern.  Echocardiogram: 08/20/2012: LVEF 45-50%, diffuse hypokinesis, grade 3 diastolic dysfunction mild AR, mildly dilated left atrium, RVSP 31 mmHg.  03/14/2023: Study Quality: Technically difficult study. Normal LV systolic function with visual EF 50-55%. Left ventricle cavity is normal in size. Severe concentric hypertrophy of the left ventricle. Normal global wall motion.  Indeterminate diastolic filling pattern, elevated LAP. Calculated EF 52%. Trace aortic regurgitation. Trace tricuspid valve regurgitation and no evidence of pulmonary hypertension. Compared to 12/20/2000 Grade II DD is now indeterminate, mild AR is now trace, otherwise no significant change.   Stress Testing: Lexiscan Sestamibi stress test 12/14/2020: Stress EKG is non-diagnostic, as this is pharmacological stress test using Lexiscan. Patient was scheduled for Exercise nuclear stress test; however, she did not reach 85% APMHR so transitioned to [place].  No apparent territory of reversible ischemia, with soft and breast tissue attenuation artifact (BMI 50.2). Left ventricular size is dilated (EDV 137 cc).  Left ventricular wall thickness is preserved without regional motion abnormalities. LVEF 58%. No prior study for comparison.  Low risk study.   Heart Catheterization: None  LABORATORY DATA:    Latest Ref Rng & Units 09/22/2014   11:30 AM 08/19/2012    5:19 PM 08/19/2012   11:53 AM  CBC  WBC 4.0 - 10.5 K/uL 6.0  8.7  8.3   Hemoglobin 12.0 - 15.0 g/dL 09.6  04.5  40.9  Hematocrit 36.0 - 46.0 % 43.7  41.2  46.6   Platelets 150 - 400 K/uL 353  335         Latest Ref Rng & Units 02/05/2022    8:14 AM 01/22/2022    8:01 AM 03/31/2021    4:12 PM  CMP  Glucose 70 - 99 mg/dL 962  97  92   BUN 6 - 20 mg/dL 14  13  16    Creatinine 0.57 - 1.00 mg/dL 9.52  8.41  3.24   Sodium 134 - 144 mmol/L 140  138  139   Potassium 3.5 - 5.2 mmol/L 4.5  5.0  3.9   Chloride 96 - 106 mmol/L 102  100  103   CO2 20 - 29 mmol/L 23  24  20    Calcium 8.7 - 10.2 mg/dL 9.7  40.1  9.3   Total Protein 6.0 - 8.5 g/dL  7.7    Total Bilirubin 0.0 - 1.2 mg/dL  0.6    Alkaline Phos 44 - 121 IU/L  59    AST 0 - 40 IU/L  14    ALT 0 - 32 IU/L  25      Lipid Panel  Lab Results  Component Value Date   CHOL 173 01/22/2022   HDL 35 (L) 01/22/2022   LDLCALC 105 (H) 01/22/2022   LDLDIRECT 90 01/22/2022   TRIG  186 (H) 01/22/2022   CHOLHDL 6.9 (H) 07/13/2020   No components found for: "NTPROBNP" No results for input(s): "PROBNP" in the last 8760 hours.  No results for input(s): "TSH" in the last 8760 hours.  BMP No results for input(s): "NA", "K", "CL", "CO2", "GLUCOSE", "BUN", "CREATININE", "CALCIUM", "GFRNONAA", "GFRAA" in the last 8760 hours.   HEMOGLOBIN A1C Lab Results  Component Value Date   HGBA1C 5.5 08/19/2012   MPG 111 08/19/2012    IMPRESSION:    ICD-10-CM   1. Chronic heart failure with preserved ejection fraction (HFpEF) (HCC)  I50.32 EKG 12-Lead    MR CARDIAC MORPHOLOGY W WO CONTRAST    Pro b natriuretic peptide (BNP)    Basic metabolic panel    Magnesium    2. Essential hypertension  I10     3. Mixed hyperlipidemia  E78.2     4. Former smoker  Z87.891     5. Class 3 severe obesity due to excess calories with serious comorbidity and body mass index (BMI) of 45.0 to 49.9 in adult [facility])  E66.01    Z68.42        RECOMMENDATIONS: Talah L Pata is a 37 y.o. female whose past medical history and cardiac risk factors include: chronic HFpEF, sleep apnea, hypertension, obesity due to excess calories (Body mass index is 47.62 kg/m.), former smoker.  Chronic HFpEF Compensated, NYHA class 2 Diagnosed in 2013.  Medications reconciled Has been evaluated by sleep medicine and is awaiting a device therapy.  She has not heard back from them for 2 months.  I have asked her to reach out to them. She is unable to go to [facility] health and wellness weight loss program due to work restraints.  I have asked her to look into [facility] or weight watchers as losing weight should be one of the biggest cornerstones management for heart failure. Echocardiogram notes stable LVEF and diastolic function has improved.  However she has severe left ventricular hypertrophy and given her young age recommend a cardiac MRI to rule out for other possibilities of her underlying  cardiomyopathy. Patient  is currently not thinking of having children's.  However if she does become pregnant I have asked her to reach out ASAP as majority of his medications should not be taken and she would be considered high risk given her history of heart failure and cardiomyopathy at a young age.  Essential hypertension Office blood pressures are not well-controlled. Home blood pressures are better controlled according to the patient. Continue current medical therapy. No changes warranted at this time.   If further medication titration is warranted we will transition her from carvedilol to labetalol.  Mixed hyperlipidemia Currently on atorvastatin.   She denies myalgia or other side effects. Will have her lipids rechecked in May 07, 2023.  I have advised her to give me a copy for reference.  Former smoker We emphasized the importance of complete smoking cessation  Class 3 severe obesity due to excess calories with serious comorbidity and body mass index (BMI) of 45.0 to 49.9 in adult [facility]) Body mass index is 47.62 kg/m. I reviewed with the patient the importance of diet, regular physical activity/exercise, weight loss.   Patient is educated on increasing physical activity gradually as tolerated.  With the goal of moderate intensity exercise for 30 minutes a day 5 days a week. Patient to consider either [facility] loss program or weight watchers. We did discuss medication such as Rybelsus and/or Wegovy to help facilitate weight loss.  However, given her young age would recommend conventional weight loss to minimize the risk of side effects.  However, this can still be discussed going forward.  FINAL MEDICATION LIST END OF ENCOUNTER: No orders of the defined types were placed in this encounter.   There are no discontinued medications.     Current Outpatient Medications:    atorvastatin (LIPITOR) 40 MG tablet, TAKE 1 TABLET BY MOUTH DAILY., Disp: 90 tablet, Rfl: 3   carvedilol  (COREG) 25 MG tablet, Take 1 and 1/2 tablets (37.5 mg) by mouth 2 times daily with a meal., Disp: 90 tablet, Rfl: 3   dapagliflozin propanediol (FARXIGA) 10 MG TABS tablet, Take 1 tablet (10 mg total) by mouth daily., Disp: 30 tablet, Rfl: 3   hydrALAZINE (APRESOLINE) 100 MG tablet, Take 1 tablet (100 mg total) by mouth 3 (three) times daily., Disp: 270 tablet, Rfl: 0   isosorbide dinitrate (ISORDIL) 20 MG tablet, Take 1 tablet by mouth 3 times daily., Disp: 90 tablet, Rfl: 2   sacubitril-valsartan (ENTRESTO) 97-103 MG, Take 1 tablet by mouth 2 (two) times daily., Disp: 180 tablet, Rfl: 0   sodium chloride (OCEAN) 0.65 % SOLN nasal spray, Place 1 spray into both nostrils as needed for congestion., Disp: 44 mL, Rfl: 0   spironolactone (ALDACTONE) 50 MG tablet, Take 1 tablet (50 mg total) by mouth daily., Disp: 30 tablet, Rfl: 3  Orders Placed This Encounter  Procedures   MR CARDIAC MORPHOLOGY W WO CONTRAST   Pro b natriuretic peptide (BNP)   Basic metabolic panel   Magnesium   EKG 12-Lead    There are no Patient Instructions on file for this visit.   --Continue cardiac medications as reconciled in final medication list. --Return in about 6 months (around 10/29/2023) for Follow up, heart failure management.. Or sooner if needed. --Continue follow-up with your primary care physician regarding the management of your other chronic comorbid conditions.  Patient's questions and concerns were addressed to her satisfaction. She voices understanding of the instructions provided during this encounter.   This note was created using a voice  recognition software as a result there may be grammatical errors inadvertently enclosed that do not reflect the nature of this encounter. Every attempt is made to correct such errors.  Tessa Lerner, [place], [facility]  Pager:  [phone] Office: [phone]

## 2023-05-06 ENCOUNTER — Telehealth: Payer: Self-pay | Admitting: Pharmacist

## 2023-05-06 NOTE — Progress Notes (Signed)
Patient attempted to be outreached by Bing Plume, PharmD Candidate on 05/06/23 to discuss hypertension. Left voicemail for patient to return our call at their convenience at [phone]  Catie Eppie Gibson, PharmD, BCACP, CPP [facility] [phone]

## 2023-05-07 ENCOUNTER — Other Ambulatory Visit: Payer: Self-pay

## 2023-05-07 ENCOUNTER — Ambulatory Visit (INDEPENDENT_AMBULATORY_CARE_PROVIDER_SITE_OTHER): Payer: Commercial Managed Care - HMO | Admitting: Student

## 2023-05-07 ENCOUNTER — Other Ambulatory Visit (HOSPITAL_COMMUNITY): Payer: Self-pay

## 2023-05-07 VITALS — BP 143/77 | HR 75 | Ht 63.0 in | Wt 269.0 lb

## 2023-05-07 DIAGNOSIS — I110 Hypertensive heart disease with heart failure: Secondary | ICD-10-CM | POA: Diagnosis not present

## 2023-05-07 DIAGNOSIS — E782 Mixed hyperlipidemia: Secondary | ICD-10-CM

## 2023-05-07 DIAGNOSIS — N1831 Chronic kidney disease, stage 3a: Secondary | ICD-10-CM

## 2023-05-07 DIAGNOSIS — J3089 Other allergic rhinitis: Secondary | ICD-10-CM

## 2023-05-07 DIAGNOSIS — I10 Essential (primary) hypertension: Secondary | ICD-10-CM

## 2023-05-07 MED ORDER — FLUTICASONE PROPIONATE 50 MCG/ACT NA SUSP
2.0000 | Freq: Every day | NASAL | 6 refills | Status: DC
  Filled 2023-05-07 – 2023-05-22 (×2): qty 16, 30d supply, fill #0
  Filled 2023-08-29: qty 16, 30d supply, fill #1
  Filled 2023-08-31: qty 48, 90d supply, fill #1
  Filled 2023-12-13: qty 48, 90d supply, fill #2

## 2023-05-07 NOTE — Patient Instructions (Signed)
It was great to see you! Thank you for allowing me to participate in your care!  Our plans for today:  - I placed a sleep study referral. Let me know if you are not called in 2 weeks to schedule an appointment. - I sent Flonase to your pharmacy for your allergies - I will talk with Dr. Odis Hollingshead about the Cardiac MRI.  We are checking some labs today, I will call you if they are abnormal will send you a MyChart message or a letter if they are normal.  If you do not hear about your labs in the next 2 weeks please let us know.  Take care and seek immediate care sooner if you develop any concerns.   Dr. Darral Dash, DO [facility]

## 2023-05-07 NOTE — Assessment & Plan Note (Signed)
BP above goal, but stable. Also had stressor with death of family friend and did not have much sleep overnight. Continue current medications

## 2023-05-07 NOTE — Assessment & Plan Note (Signed)
If worsening renal function on labs, consider nephro consult  Can consider renal ultrasound

## 2023-05-07 NOTE — Assessment & Plan Note (Addendum)
Euvolemic on exam. Normal WOB.

## 2023-05-07 NOTE — Assessment & Plan Note (Signed)
Filled Flonase.

## 2023-05-07 NOTE — Progress Notes (Signed)
SUBJECTIVE:   CHIEF COMPLAINT / HPI:   Robin May is a 37 year old female with a history of HFpEF, CKD stage III, hyperlipidemia, hypertension here for follow-up.  She was last seen at [facility] in 2022.  She was recently seen by Dr. Odis Hollingshead with cardiology on 04/22/2023.  She discussed that patient should reach out in the event of positive pregnancy test given she is on multiple medications that can be harmful in pregnancy.  She works as a Emergency planning/management officer.  Allergies Requesting Flonase for her sinusitis Worked well for her int he past  Contraception management Does not plan on kids, unless it happens. Uses condoms for contraception.   Last Pap smear 2021 (NILM, negative high-risk HPV), will be due next year in 2026.  HTN Current regimen: Coreg 25mg  BID, Entresto 97-103mg , Spironolactone 50mg , Farxiga 10 mg daily BPs at home  , usually the highest is 145 when she is running around, lowest is usually 130s/77 Last BMP 03/31/2021, creatinine 1.32, K3.9 Last seen by cardiology on 04/20/2021, no changes were made Patient was up all night because her best friend's mother passed away.   Hyperlipidemia Current regimen: Lipitor 40 mg daily Last lipid panel 2022, due today   CKD stage III a Will need labs today.  Has never followed with nephrology. Last BMP on 01/21/2022, creatinine 1.36 with GFR 52   HFpEF Last echo on 03/14/2023 EF 52% severe concentric hypertrophy of the left ventricle. Grade II DD is now indeterminate  Denies chest pain, shortness of breath, leg edema   Past Medical History:  Diagnosis Date   Allergy    Anginal pain (HCC) 08/18/2012   "mild"   CHF (congestive heart failure) (HCC)    Heart failure (HCC) 2016   Hyperlipidemia    Hypertension    Shortness of breath 08/19/2012   "w/exertion and w/lying down"    Patient Care Team: Darral Dash, DO as PCP - General (Family Medicine) Yates Decamp, MD as Consulting Physician (Cardiology) OBJECTIVE:  BP (!) 143/77   Pulse 75    Ht 5\' 3"  (1.6 m)   Wt 269 lb (122 kg)   LMP 04/08/2023   SpO2 99%   BMI 47.65 kg/m  Physical Exam Constitutional:      Appearance: Normal appearance. She is obese.  HENT:     Nose: Nose normal.     Mouth/Throat:     Mouth: Mucous membranes are moist.     Pharynx: Oropharynx is clear.  Eyes:     Extraocular Movements: Extraocular movements intact.     Conjunctiva/sclera: Conjunctivae normal.     Pupils: Pupils are equal, round, and reactive to light.  Cardiovascular:     Rate and Rhythm: Normal rate and regular rhythm.  Pulmonary:     Effort: Pulmonary effort is normal.     Breath sounds: Normal breath sounds.  Abdominal:     General: Bowel sounds are normal.     Palpations: Abdomen is soft.  Skin:    General: Skin is warm and dry.  Neurological:     Mental Status: She is alert.      ASSESSMENT/PLAN:  Mixed hyperlipidemia -     Lipid panel  Morbid obesity (HCC) -     Ambulatory referral to Sleep Studies  Hypertensive heart disease with congestive heart failure, unspecified heart failure type [facility]) Assessment & Plan: Euvolemic on exam. Normal WOB.  Orders: -     Basic metabolic panel -     Pro b natriuretic peptide (BNP)  Stage  3a chronic kidney disease (HCC) Assessment & Plan: If worsening renal function on labs, consider nephro consult  Can consider renal ultrasound  Orders: -     CBC  Essential hypertension Assessment & Plan: BP above goal, but stable. Also had stressor with death of family friend and did not have much sleep overnight. Continue current medications  Orders: -     Hemoglobin A1c  Perennial allergic rhinitis Assessment & Plan: Filled Flonase.    Other orders -     Fluticasone Propionate; Place 2 sprays into both nostrils daily.  Dispense: 16 g; Refill: 6   No follow-ups on file. Darral Dash, DO 05/07/2023, 2:39 PM PGY-2, [place] [facility]

## 2023-05-08 ENCOUNTER — Telehealth: Payer: Self-pay | Admitting: Student

## 2023-05-08 ENCOUNTER — Other Ambulatory Visit: Payer: Self-pay | Admitting: Student

## 2023-05-08 DIAGNOSIS — N1832 Chronic kidney disease, stage 3b: Secondary | ICD-10-CM

## 2023-05-08 LAB — CBC
Hematocrit: 45.4 % (ref 34.0–46.6)
Hemoglobin: 14.6 g/dL (ref 11.1–15.9)
MCH: 27.4 pg (ref 26.6–33.0)
MCHC: 32.2 g/dL (ref 31.5–35.7)
MCV: 85 fL (ref 79–97)
Platelets: 349 10*3/uL (ref 150–450)
RBC: 5.32 x10E6/uL — ABNORMAL HIGH (ref 3.77–5.28)
RDW: 13.7 % (ref 11.7–15.4)
WBC: 6.1 10*3/uL (ref 3.4–10.8)

## 2023-05-08 LAB — BASIC METABOLIC PANEL
BUN/Creatinine Ratio: 13 (ref 9–23)
BUN: 23 mg/dL — ABNORMAL HIGH (ref 6–20)
CO2: 22 mmol/L (ref 20–29)
Calcium: 9.6 mg/dL (ref 8.7–10.2)
Chloride: 102 mmol/L (ref 96–106)
Creatinine, Ser: 1.78 mg/dL — ABNORMAL HIGH (ref 0.57–1.00)
Glucose: 81 mg/dL (ref 70–99)
Potassium: 4.5 mmol/L (ref 3.5–5.2)
Sodium: 139 mmol/L (ref 134–144)
eGFR: 38 mL/min/{1.73_m2} — ABNORMAL LOW (ref >59)

## 2023-05-08 LAB — LIPID PANEL
Chol/HDL Ratio: 4 ratio (ref 0.0–4.4)
Cholesterol, Total: 145 mg/dL (ref 100–199)
HDL: 36 mg/dL — ABNORMAL LOW (ref >39)
LDL Chol Calc (NIH): 86 mg/dL (ref 0–99)
Triglycerides: 129 mg/dL (ref 0–149)
VLDL Cholesterol Cal: 23 mg/dL (ref 5–40)

## 2023-05-08 LAB — PRO B NATRIURETIC PEPTIDE: NT-Pro BNP: 756 pg/mL — ABNORMAL HIGH (ref 0–130)

## 2023-05-08 LAB — HEMOGLOBIN A1C
Est. average glucose Bld gHb Est-mCnc: 111 mg/dL
Hgb A1c MFr Bld: 5.5 % (ref 4.8–5.6)

## 2023-05-08 NOTE — Progress Notes (Signed)
Placed order for renal ultrasound and nephrology referral.

## 2023-05-08 NOTE — Telephone Encounter (Signed)
Discussed decline in renal function with patient's cardiologist Dr. Odis Hollingshead- would like patient to discontinue Spironolactone.  Called patient to relay this information. She took today's dose, but will stop tomorrow.  Patient to schedule lab visit for repeat BMP in 1 week. Future order placed.  Darral Dash, DO PGY-2 Family Medicine

## 2023-05-14 ENCOUNTER — Other Ambulatory Visit: Payer: Commercial Managed Care - HMO

## 2023-05-14 DIAGNOSIS — N1832 Chronic kidney disease, stage 3b: Secondary | ICD-10-CM

## 2023-05-15 ENCOUNTER — Other Ambulatory Visit (HOSPITAL_COMMUNITY): Payer: Self-pay

## 2023-05-15 LAB — BASIC METABOLIC PANEL
BUN/Creatinine Ratio: 13 (ref 9–23)
BUN: 23 mg/dL — ABNORMAL HIGH (ref 6–20)
CO2: 21 mmol/L (ref 20–29)
Calcium: 9.4 mg/dL (ref 8.7–10.2)
Chloride: 103 mmol/L (ref 96–106)
Creatinine, Ser: 1.71 mg/dL — ABNORMAL HIGH (ref 0.57–1.00)
Glucose: 95 mg/dL (ref 70–99)
Potassium: 4.3 mmol/L (ref 3.5–5.2)
Sodium: 135 mmol/L (ref 134–144)
eGFR: 40 mL/min/{1.73_m2} — ABNORMAL LOW (ref >59)

## 2023-05-15 NOTE — Progress Notes (Signed)
Called patient to discuss renal function: Cr 1.71, GFR 40, ever so slightly improved but not where we want her to be despite holding Spironolactone.  Renal ultrasound scheduled for 05/23/23.   Nephrology referral placed.  Darral Dash, DO PGY-2 [facility]

## 2023-05-15 NOTE — Addendum Note (Signed)
Addended by: Darral Dash on: 05/15/2023 08:43 AM   Modules accepted: Orders

## 2023-05-16 ENCOUNTER — Other Ambulatory Visit (HOSPITAL_COMMUNITY): Payer: Commercial Managed Care - HMO

## 2023-05-19 ENCOUNTER — Other Ambulatory Visit: Payer: Self-pay | Admitting: Cardiology

## 2023-05-19 ENCOUNTER — Other Ambulatory Visit: Payer: Self-pay | Admitting: Student

## 2023-05-20 ENCOUNTER — Other Ambulatory Visit (HOSPITAL_COMMUNITY): Payer: Self-pay

## 2023-05-20 MED ORDER — DAPAGLIFLOZIN PROPANEDIOL 10 MG PO TABS
10.0000 mg | ORAL_TABLET | Freq: Every day | ORAL | 3 refills | Status: DC
  Filled 2023-05-20: qty 30, 30d supply, fill #0
  Filled 2023-06-27 – 2023-08-31 (×6): qty 30, 30d supply, fill #1
  Filled 2023-10-08: qty 30, 30d supply, fill #2
  Filled 2023-11-10: qty 30, 30d supply, fill #3

## 2023-05-21 ENCOUNTER — Other Ambulatory Visit (HOSPITAL_COMMUNITY): Payer: Self-pay

## 2023-05-21 MED ORDER — ENTRESTO 97-103 MG PO TABS
1.0000 | ORAL_TABLET | Freq: Two times a day (BID) | ORAL | 0 refills | Status: DC
  Filled 2023-05-21: qty 180, 90d supply, fill #0

## 2023-05-22 ENCOUNTER — Other Ambulatory Visit (HOSPITAL_COMMUNITY): Payer: Self-pay

## 2023-05-23 ENCOUNTER — Ambulatory Visit (HOSPITAL_COMMUNITY)
Admission: RE | Admit: 2023-05-23 | Discharge: 2023-05-23 | Disposition: A | Discharge status: Home / Self Care | Payer: Commercial Managed Care - HMO | Source: Ambulatory Visit | Attending: Family Medicine | Admitting: Family Medicine

## 2023-05-23 DIAGNOSIS — N1832 Chronic kidney disease, stage 3b: Secondary | ICD-10-CM | POA: Diagnosis present

## 2023-06-27 ENCOUNTER — Other Ambulatory Visit: Payer: Self-pay | Admitting: Cardiology

## 2023-06-27 ENCOUNTER — Other Ambulatory Visit (HOSPITAL_COMMUNITY): Payer: Self-pay

## 2023-06-27 ENCOUNTER — Other Ambulatory Visit: Payer: Self-pay

## 2023-06-27 MED ORDER — ENTRESTO 97-103 MG PO TABS
1.0000 | ORAL_TABLET | Freq: Two times a day (BID) | ORAL | 0 refills | Status: DC
  Filled 2023-06-27 – 2023-08-29 (×3): qty 180, 90d supply, fill #0
  Filled 2023-08-31: qty 60, 30d supply, fill #0
  Filled 2023-11-01: qty 60, 30d supply, fill #1
  Filled 2023-12-13: qty 60, 30d supply, fill #2

## 2023-06-28 ENCOUNTER — Other Ambulatory Visit (HOSPITAL_COMMUNITY): Payer: Self-pay

## 2023-07-01 ENCOUNTER — Ambulatory Visit (INDEPENDENT_AMBULATORY_CARE_PROVIDER_SITE_OTHER): Payer: Commercial Managed Care - HMO | Admitting: Neurology

## 2023-07-01 ENCOUNTER — Other Ambulatory Visit (HOSPITAL_COMMUNITY): Payer: Self-pay

## 2023-07-01 ENCOUNTER — Encounter: Payer: Self-pay | Admitting: Neurology

## 2023-07-01 VITALS — BP 148/72 | HR 80 | Ht 63.0 in | Wt 273.0 lb

## 2023-07-01 DIAGNOSIS — Z9189 Other specified personal risk factors, not elsewhere classified: Secondary | ICD-10-CM

## 2023-07-01 DIAGNOSIS — Z6841 Body Mass Index (BMI) 40.0 and over, adult: Secondary | ICD-10-CM

## 2023-07-01 DIAGNOSIS — I5032 Chronic diastolic (congestive) heart failure: Secondary | ICD-10-CM

## 2023-07-01 DIAGNOSIS — R519 Headache, unspecified: Secondary | ICD-10-CM

## 2023-07-01 DIAGNOSIS — R0683 Snoring: Secondary | ICD-10-CM | POA: Diagnosis not present

## 2023-07-01 DIAGNOSIS — R351 Nocturia: Secondary | ICD-10-CM

## 2023-07-01 NOTE — Progress Notes (Signed)
Subjective:    Patient ID: Robin May is a 37 y.o. female.  HPI    Huston Foley, MD, PhD [facility] [address] [address] [place], [place] [address]  Dear Dr. Melissa Noon,   I saw your patient, Robin May, upon your kind request in my sleep clinic today for initial consultation of her sleep disorder, in particular, concern for underlying obstructive sleep apnea.  The patient is unaccompanied today.  As you know, Ms. Franzel is a 37 year old female with an underlying medical history of hypertension, hyperlipidemia, chronic kidney disease, allergies, left ventricular hypertrophy, congestive heart failure, and severe obesity with a BMI of over 45, who reports snoring and some daytime somnolence.  Sleep is interrupted.  Her Epworth sleepiness score is 5/24, fatigue severity score is 19/63.  I reviewed your office note from 05/07/2023.  She reports that her cardiologist encouraged her to have a sleep study.  She had a consultation with a sleep provider about a year ago at [facility] but did not end up having a sleep study.  Bedtime is generally between 10:30 PM and 11 PM and rise time around 6:30 AM.  She works as a Emergency planning/management officer.  She has nocturia about 2-3 times per night on average, denies morning headaches but has had occasional nocturnal headaches attributed to her blood pressure medication.  She lives with her mom.  She has no children.  No pets in the household.  She does have a TV in her bedroom but it is typically not on at night.  It certainly does not stay on all night.  She is working on weight loss.  Weight has been more or less stable.  She drinks caffeine occasionally, not daily.  She drinks alcohol in the form of wine occasionally.  She is a non-smoker.  She has no known family history of sleep apnea.  Her Past Medical History Is Significant For: Past Medical History:  Diagnosis Date   Allergy    Anginal pain (HCC) 08/18/2012   "mild"   CHF  (congestive heart failure) (HCC)    Heart failure (HCC) 2016   Hyperlipidemia    Hypertension    Shortness of breath 08/19/2012   "w/exertion and w/lying down"    Her Past Surgical History Is Significant For: Past Surgical History:  Procedure Laterality Date   NO PAST SURGERIES      Her Family History Is Significant For: Family History  Problem Relation Age of Onset   Hypertension Mother    Thyroid disease Mother    Diabetes Father    Heart failure Sister    Asthma Sister    Asthma Brother    Hypertension Maternal Grandmother    Hypertension Maternal Grandfather    Heart disease Maternal Grandfather    Kidney failure Maternal Grandfather    Hypertension Paternal Grandmother    Hypertension Paternal Grandfather     Her Social History Is Significant For: Social History   Socioeconomic History   Marital status: Single    Spouse name: n/a   Number of children: 0   Years of education: Associate's Degree   Highest education level: Associate degree: occupational, Scientist, product/process development, or vocational program  Occupational History   Occupation: Runner, broadcasting/film/video    Comment: pre-K  Tobacco Use   Smoking status: Former    Types: Cigars    Quit date: 12/31/2005    Years since quitting: 17.5   Smokeless tobacco: Never   Tobacco comments:    08/19/2012 "smoked  black and milds"  Vaping Use   Vaping Use: Never used  Substance and Sexual Activity   Alcohol use: Never   Drug use: No   Sexual activity: Not Currently    Comment: Last sexual activity 02/2012.  Other Topics Concern   Not on file  Social History Narrative   Lives with: mother   Exercise: gym three days per week (elliptical, treadmill, weights).   Social Determinants of Health   Financial Resource Strain: Medium Risk (05/07/2023)   Overall Financial Resource Strain (CARDIA)    Difficulty of Paying Living Expenses: Somewhat hard  Food Insecurity: No Food Insecurity (05/07/2023)   Hunger Vital Sign    Worried About Running Out of Food  in the Last Year: Never true    Ran Out of Food in the Last Year: Never true  Transportation Needs: No Transportation Needs (05/07/2023)   PRAPARE - Administrator, Civil Service (Medical): No    Lack of Transportation (Non-Medical): No  Physical Activity: Insufficiently Active (05/07/2023)   Exercise Vital Sign    Days of Exercise per Week: 3 days    Minutes of Exercise per Session: 30 min  Stress: No Stress Concern Present (05/07/2023)   [employer] of Occupational Health - Occupational Stress Questionnaire    Feeling of Stress : Only a little  Social Connections: Moderately Integrated (05/07/2023)   Social Connection and Isolation Panel [NHANES]    Frequency of Communication with Friends and Family: More than three times a week    Frequency of Social Gatherings with Friends and Family: Once a week    Attends Religious Services: More than 4 times per year    Active Member of [employer] or Organizations: Yes    Attends Banker Meetings: 1 to 4 times per year    Marital Status: Never married    Her Allergies Are:  No Known Allergies:   Her Current Medications Are:  Outpatient Encounter Medications as of 07/01/2023  Medication Sig   atorvastatin (LIPITOR) 40 MG tablet TAKE 1 TABLET BY MOUTH DAILY.   carvedilol (COREG) 25 MG tablet Take 1 and 1/2 tablets (37.5 mg) by mouth 2 times daily with a meal.   dapagliflozin propanediol (FARXIGA) 10 MG TABS tablet Take 1 tablet (10 mg total) by mouth daily.   fluticasone (FLONASE) 50 MCG/ACT nasal spray Place 2 sprays into both nostrils daily.   hydrALAZINE (APRESOLINE) 100 MG tablet Take 1 tablet (100 mg total) by mouth 3 (three) times daily.   isosorbide dinitrate (ISORDIL) 20 MG tablet Take 1 tablet by mouth 3 times daily.   sacubitril-valsartan (ENTRESTO) 97-103 MG Take 1 tablet by mouth 2 times daily.   sodium chloride (OCEAN) 0.65 % SOLN nasal spray Place 1 spray into both nostrils as needed for congestion.    [DISCONTINUED] spironolactone (ALDACTONE) 50 MG tablet Take 1 tablet (50 mg total) by mouth daily.   No facility-administered encounter medications on file as of 07/01/2023.  :   Review of Systems:  Out of a complete 14 point review of systems, all are reviewed and negative with the exception of these symptoms as listed below:   Review of Systems  Neurological:        RM 5 alone Pt is well, reports she doesn't have any trouble with falling or staying asleep. Gets about 7 hrs of sleep a night, but has increase in daytime fatigue. Also has some snoring, no known apneas.     Objective:  Neurological Exam  Physical Exam Physical Examination:   Vitals:   07/01/23 0906  BP: (!) 148/72  Pulse: 80    General Examination: The patient is a very pleasant 37 y.o. female in no acute distress. She appears well-developed and well-nourished and well groomed.   HEENT: Normocephalic, atraumatic, pupils are equal, round and reactive to light, extraocular tracking is good without limitation to gaze excursion or nystagmus noted. Hearing is grossly intact. Face is symmetric with normal facial animation. Speech is clear with no dysarthria noted. There is no hypophonia. There is no lip, neck/head, jaw or voice tremor. Neck is supple with full range of passive and active motion. There are no carotid bruits on auscultation. Oropharynx exam reveals: mild mouth dryness, good dental hygiene and mild airway crowding, due to smaller airway entry, tonsils 1+ bilaterally, uvula small, but wider tongue. Mallampati is class II. Tongue protrudes centrally and palate elevates symmetrically. Neck size 18 in. Minimal to mild overbite.   Chest: Clear to auscultation without wheezing, rhonchi or crackles noted.  Heart: S1+S2+0, regular and normal without murmurs, rubs or gallops noted.   Abdomen: Soft, non-tender and non-distended.  Extremities: There is no pitting edema in the distal lower extremities bilaterally.    Skin: Warm and dry without trophic changes noted.   Musculoskeletal: exam reveals no obvious joint deformities.   Neurologically:  Mental status: The patient is awake, alert and oriented in all 4 spheres. Her immediate and remote memory, attention, language skills and fund of knowledge are appropriate. There is no evidence of aphasia, agnosia, apraxia or anomia. Speech is clear with normal prosody and enunciation. Thought process is linear. Mood is normal and affect is normal.  Cranial nerves II - XII are as described above under HEENT exam.  Motor exam: Normal bulk, strength and tone is noted. There is no obvious action or resting tremor.  Fine motor skills and coordination: grossly intact.  Cerebellar testing: No dysmetria or intention tremor. There is no truncal or gait ataxia.  Sensory exam: intact to light touch in the upper and lower extremities.  Gait, station and balance: She stands easily. No veering to one side is noted. No leaning to one side is noted. Posture is age-appropriate and stance is narrow based. Gait shows normal stride length and normal pace. No problems turning are noted.   Assessment and Plan:  In summary, Ethelda L Hagins is a very pleasant 37 y.o.-year old female with an underlying medical history of hypertension, hyperlipidemia, chronic kidney disease, allergies, left ventricular hypertrophy, congestive heart failure, and severe obesity with a BMI of over 45, whose history and physical exam are concerning for sleep disordered breathing, particularly obstructive sleep apnea (OSA). A laboratory attended sleep study is typically considered "gold standard" for evaluation of sleep disordered breathing.   I had a long chat with the patient about my findings and the diagnosis of sleep apnea, particularly OSA, its prognosis and treatment options. We talked about medical/conservative treatments, surgical interventions and non-pharmacological approaches for symptom control. I  explained, in particular, the risks and ramifications of untreated moderate to severe OSA, especially with respect to developing cardiovascular disease down the road, including congestive heart failure (CHF), difficult to treat hypertension, cardiac arrhythmias (particularly A-fib), neurovascular complications including TIA, stroke and dementia. Even type 2 diabetes has, in part, been linked to untreated OSA. Symptoms of untreated OSA may include (but may not be limited to) daytime sleepiness, nocturia (i.e. frequent nighttime urination), memory problems, mood irritability and suboptimally controlled or worsening mood  disorder such as depression and/or anxiety, lack of energy, lack of motivation, physical discomfort, as well as recurrent headaches, especially morning or nocturnal headaches. We talked about the importance of maintaining a healthy lifestyle and striving for healthy weight. In addition, we talked about the importance of striving for and maintaining good sleep hygiene. I recommended a sleep study at this time. I outlined the differences between a laboratory attended sleep study which is considered more comprehensive and accurate over the option of a home sleep test (HST); the latter may lead to underestimation of sleep disordered breathing in some instances and does not help with diagnosing upper airway resistance syndrome and is not accurate enough to diagnose primary central sleep apnea typically. I outlined possible surgical and non-surgical treatment options of OSA, including the use of a positive airway pressure (PAP) device (i.e. CPAP, AutoPAP/APAP or BiPAP in certain circumstances), a custom-made dental device (aka oral appliance, which would require a referral to a specialist dentist or orthodontist typically, and is generally speaking not considered for patients with full dentures or edentulous state), upper airway surgical options, such as traditional UPPP (which is not considered a  first-line treatment) or the Inspire device (hypoglossal nerve stimulator, which would involve a referral for consultation with an ENT surgeon, after careful selection, following inclusion criteria - also not first-line treatment). I explained the PAP treatment option to the patient in detail, as this is generally considered first-line treatment.  The patient indicated that she would be willing to try PAP therapy, if the need arises. I explained the importance of being compliant with PAP treatment, not only for insurance purposes but primarily to improve patient's symptoms symptoms, and for the patient's long term health benefit, including to reduce Her cardiovascular risks longer-term.    We will pick up our discussion about the next steps and treatment options after testing.  We will keep her posted as to the test results by phone call and/or MyChart messaging where possible.  We will plan to follow-up in sleep clinic accordingly as well.  I answered all her questions today and the patient was in agreement.   I encouraged her to call with any interim questions, concerns, problems or updates or email [place] through MyChart.  Generally speaking, sleep test authorizations may take up to 2 weeks, sometimes less, sometimes longer, the patient is encouraged to get in touch with [place] if they do not hear back from the sleep lab staff directly within the next 2 weeks.  Thank you very much for allowing me to participate in the care of this nice patient. If I can be of any further assistance to you please do not hesitate to call me at [phone].  Sincerely,   Huston Foley, MD, PhD

## 2023-07-01 NOTE — Patient Instructions (Signed)

## 2023-07-02 ENCOUNTER — Other Ambulatory Visit (HOSPITAL_COMMUNITY): Payer: Self-pay

## 2023-07-03 ENCOUNTER — Other Ambulatory Visit (HOSPITAL_COMMUNITY): Payer: Self-pay

## 2023-07-10 ENCOUNTER — Other Ambulatory Visit (HOSPITAL_COMMUNITY): Payer: Self-pay

## 2023-07-11 ENCOUNTER — Other Ambulatory Visit (HOSPITAL_COMMUNITY): Payer: Self-pay

## 2023-07-15 ENCOUNTER — Other Ambulatory Visit (HOSPITAL_COMMUNITY): Payer: Self-pay

## 2023-07-17 ENCOUNTER — Telehealth: Payer: Self-pay | Admitting: Neurology

## 2023-07-17 NOTE — Telephone Encounter (Signed)
[employer] pending faxed notes  

## 2023-07-18 NOTE — Telephone Encounter (Signed)
[employer]- Rosann Auerbach Berkley Harvey: [id number] (exp. 07/18/23 to 10/16/23)

## 2023-07-27 ENCOUNTER — Encounter: Payer: Self-pay | Admitting: Neurology

## 2023-07-29 ENCOUNTER — Other Ambulatory Visit (HOSPITAL_COMMUNITY): Payer: Self-pay

## 2023-07-29 ENCOUNTER — Encounter (HOSPITAL_COMMUNITY): Payer: Self-pay

## 2023-07-29 NOTE — Telephone Encounter (Signed)
Please see tele phone not from 07/17/23. Sent patient a [employer] geologist.

## 2023-07-30 ENCOUNTER — Other Ambulatory Visit: Payer: Self-pay

## 2023-07-30 ENCOUNTER — Other Ambulatory Visit (HOSPITAL_COMMUNITY): Payer: Self-pay

## 2023-07-31 ENCOUNTER — Other Ambulatory Visit (HOSPITAL_COMMUNITY): Payer: Self-pay

## 2023-08-02 ENCOUNTER — Encounter (HOSPITAL_COMMUNITY): Payer: Self-pay

## 2023-08-02 ENCOUNTER — Other Ambulatory Visit: Payer: Self-pay | Admitting: Student

## 2023-08-02 ENCOUNTER — Encounter: Payer: Self-pay | Admitting: Student

## 2023-08-02 ENCOUNTER — Other Ambulatory Visit (HOSPITAL_COMMUNITY): Payer: Self-pay

## 2023-08-02 MED ORDER — MONTELUKAST SODIUM 10 MG PO TABS
10.0000 mg | ORAL_TABLET | Freq: Every day | ORAL | 3 refills | Status: DC
  Filled 2023-08-02 – 2023-08-05 (×2): qty 90, 90d supply, fill #0
  Filled 2023-10-08 – 2024-04-14 (×3): qty 90, 90d supply, fill #1
  Filled 2024-08-08: qty 90, 90d supply, fill #2

## 2023-08-05 ENCOUNTER — Other Ambulatory Visit (HOSPITAL_COMMUNITY): Payer: Self-pay

## 2023-08-10 ENCOUNTER — Encounter (HOSPITAL_COMMUNITY): Payer: Self-pay

## 2023-08-10 ENCOUNTER — Ambulatory Visit (HOSPITAL_COMMUNITY): Payer: Commercial Managed Care - HMO

## 2023-08-10 ENCOUNTER — Other Ambulatory Visit (HOSPITAL_COMMUNITY): Payer: Self-pay

## 2023-08-10 ENCOUNTER — Ambulatory Visit (HOSPITAL_COMMUNITY)
Admission: EM | Admit: 2023-08-10 | Discharge: 2023-08-10 | Disposition: A | Discharge status: Home / Self Care | Payer: Commercial Managed Care - HMO | Source: Home / Self Care

## 2023-08-10 DIAGNOSIS — R051 Acute cough: Secondary | ICD-10-CM

## 2023-08-10 DIAGNOSIS — J329 Chronic sinusitis, unspecified: Secondary | ICD-10-CM

## 2023-08-10 DIAGNOSIS — R059 Cough, unspecified: Secondary | ICD-10-CM

## 2023-08-10 DIAGNOSIS — J40 Bronchitis, not specified as acute or chronic: Secondary | ICD-10-CM

## 2023-08-10 MED ORDER — BENZONATATE 100 MG PO CAPS
100.0000 mg | ORAL_CAPSULE | Freq: Three times a day (TID) | ORAL | 0 refills | Status: DC
  Filled 2023-08-10: qty 21, 7d supply, fill #0

## 2023-08-10 MED ORDER — DOXYCYCLINE HYCLATE 100 MG PO CAPS
100.0000 mg | ORAL_CAPSULE | Freq: Two times a day (BID) | ORAL | 0 refills | Status: DC
  Filled 2023-08-10: qty 20, 10d supply, fill #0

## 2023-08-10 NOTE — ED Triage Notes (Signed)
Patient here today with c/o productive cough and chest congestion since Monday. Patient took Robitussin DM and salt water gargles with some relief. She has a h/o pneumonia as a child and 2 years ago. She works at a daycare.

## 2023-08-10 NOTE — Discharge Instructions (Signed)
Your x-ray did not show any evidence of pneumonia.  We are going to cover for sinus/bronchitis given your history.  Start doxycycline 100 mg.  Stay out of the sun while on this medication.  Continue using over-the-counter medication including Flonase, Mucinex, nasal saline/sinus rinses.  I have called in Tessalon for cough.  Make sure that you are resting and drinking plenty of fluids.  If your symptoms are improving within a week please return for reevaluation.  If you have any worsening symptoms including chest pain, shortness of breath, worsening cough, fever, nausea, vomiting you need to be seen immediately.

## 2023-08-10 NOTE — ED Provider Notes (Signed)
[facility]    CSN: [id number] Arrival date & time: 08/10/23  1006      History   Chief Complaint Chief Complaint  Patient presents with   Cough    HPI Robin May is a 37 y.o. female.   Patient presents today with a week plus long history of URI symptoms including productive cough, chest congestion, nasal congestion.  She denies any fever, chest pain, shortness of breath, nausea, vomiting.  She has tried salt water gargles as well as Robitussin DM with temporary improvement of symptoms.  She has seasonal allergies and uses fluticasone as well as montelukast to manage the symptoms.  She has been compliant with this regimen.  She denies history of asthma, COPD, smoking.  She does have a history of heart failure but reports that she is not experiencing any increased fluid retention or orthopnea.  She has not had COVID in the past.  She has not had COVID-19 vaccines.  She denies any recent antibiotics or steroids.  She is confident that she is not pregnant.  She does have a history of pneumonia in the past with last episode approximately 2 years ago.    Past Medical History:  Diagnosis Date   Allergy    Anginal pain (HCC) 08/18/2012   "mild"   CHF (congestive heart failure) (HCC)    Heart failure (HCC) 2016   Hyperlipidemia    Hypertension    Shortness of breath 08/19/2012   "w/exertion and w/lying down"    Patient Active Problem List   Diagnosis Date Noted   CKD (chronic kidney disease) stage 3, GFR 30-59 ml/min (HCC) 08/18/2020   Mixed hyperlipidemia 08/18/2020   Chronic diastolic congestive heart failure (HCC) 07/13/2020   Essential hypertension 07/13/2020   Perennial allergic rhinitis 09/22/2014   Hypertensive CHF (congestive heart failure) (HCC) 08/19/2012   Morbid obesity (HCC) 08/19/2012    Past Surgical History:  Procedure Laterality Date   NO PAST SURGERIES      OB History     Gravida  0   Para  0   Term  0   Preterm  0   AB  0    Living  0      SAB  0   IAB  0   Ectopic  0   Multiple  0   Live Births  0            Home Medications    Prior to Admission medications   Medication Sig Start Date End Date Taking? Authorizing Provider  atorvastatin (LIPITOR) 40 MG tablet TAKE 1 TABLET BY MOUTH DAILY. 08/06/22 10/29/23  Alfredo Martinez, MD  benzonatate (TESSALON) 100 MG capsule Take 1 capsule (100 mg total) by mouth every 8 (eight) hours. 08/10/23  Yes ,  K, PA-C  carvedilol (COREG) 25 MG tablet Take 1 and 1/2 tablets (37.5 mg) by mouth 2 times daily with a meal. 02/11/23   Dameron, Nolberto Hanlon, DO  dapagliflozin propanediol (FARXIGA) 10 MG TABS tablet Take 1 tablet (10 mg total) by mouth daily. 05/20/23   Dameron, Nolberto Hanlon, DO  doxycycline (VIBRAMYCIN) 100 MG capsule Take 1 capsule (100 mg total) by mouth 2 (two) times daily. 08/10/23  Yes ,  K, PA-C  fluticasone (FLONASE) 50 MCG/ACT nasal spray Place 2 sprays into both nostrils daily. 05/07/23   Dameron, Nolberto Hanlon, DO  hydrALAZINE (APRESOLINE) 100 MG tablet Take 1 tablet (100 mg total) by mouth 3 (three) times daily. 09/13/22 07/01/23  Tessa Lerner, DO  isosorbide dinitrate (ISORDIL) 20 MG tablet Take 1 tablet by mouth 3 times daily. 09/11/22 07/01/23  Yates Decamp, MD  montelukast (SINGULAIR) 10 MG tablet Take 1 tablet (10 mg total) by mouth at bedtime. 08/02/23   Dameron, Nolberto Hanlon, DO  sacubitril-valsartan (ENTRESTO) 97-103 MG Take 1 tablet by mouth 2 times daily. 06/27/23   Tolia, Sunit, DO  sodium chloride (OCEAN) 0.65 % SOLN nasal spray Place 1 spray into both nostrils as needed for congestion. 05/26/20   Darr, Gerilyn Pilgrim, PA-C    Family History Family History  Problem Relation Age of Onset   Hypertension Mother    Thyroid disease Mother    Diabetes Father    Heart failure Sister    Asthma Sister    Asthma Brother    Hypertension Maternal Grandmother    Hypertension Maternal Grandfather    Heart disease Maternal Grandfather    Kidney failure Maternal  Grandfather    Hypertension Paternal Grandmother    Hypertension Paternal Grandfather     Social History Social History   Tobacco Use   Smoking status: Former    Types: Cigars    Quit date: 12/31/2005    Years since quitting: 17.6   Smokeless tobacco: Never   Tobacco comments:    08/19/2012 "smoked black and milds"  Vaping Use   Vaping status: Never Used  Substance Use Topics   Alcohol use: Never   Drug use: No     Allergies   Patient has no known allergies.   Review of Systems Review of Systems  Constitutional:  Positive for activity change. Negative for appetite change, fatigue and fever.  HENT:  Positive for congestion. Negative for postnasal drip, sinus pressure, sneezing and sore throat.   Respiratory:  Positive for cough. Negative for shortness of breath.   Cardiovascular:  Negative for chest pain.  Gastrointestinal:  Negative for abdominal pain, diarrhea, nausea and vomiting.  Neurological:  Negative for dizziness, light-headedness and headaches.     Physical Exam Triage Vital Signs ED Triage Vitals  Encounter Vitals Group     BP 08/10/23 1043 (!) 159/102     Systolic BP Percentile --      Diastolic BP Percentile --      Pulse Rate 08/10/23 1043 78     Resp 08/10/23 1043 16     Temp 08/10/23 1043 98.6 F (37 C)     Temp Source 08/10/23 1043 Oral     SpO2 08/10/23 1043 95 %     Weight 08/10/23 1043 268 lb (121.6 kg)     Height 08/10/23 1043 5\' 3"  (1.6 m)     Head Circumference --      Peak Flow --      Pain Score 08/10/23 1042 0     Pain Loc --      Pain Education --      Exclude from Growth Chart --    No data found.  Updated Vital Signs BP (!) 159/102 (BP Location: Left Arm)   Pulse 78   Temp 98.6 F (37 C) (Oral)   Resp 16   Ht 5\' 3"  (1.6 m)   Wt 268 lb (121.6 kg)   LMP 08/09/2023 (Exact Date)   SpO2 95%   BMI 47.47 kg/m   Visual Acuity Right Eye Distance:   Left Eye Distance:   Bilateral Distance:    Right Eye Near:   Left Eye  Near:    Bilateral Near:     Physical Exam Vitals reviewed.  Constitutional:  General: She is awake. She is not in acute distress.    Appearance: Normal appearance. She is well-developed. She is not ill-appearing.     Comments: Very pleasant female appears stated age in no acute distress sitting comfortably in exam room.  HENT:     Head: Normocephalic and atraumatic.     Right Ear: Tympanic membrane, ear canal and external ear normal. Tympanic membrane is not erythematous or bulging.     Left Ear: Tympanic membrane, ear canal and external ear normal. Tympanic membrane is not erythematous or bulging.     Nose:     Right Sinus: No maxillary sinus tenderness or frontal sinus tenderness.     Left Sinus: No maxillary sinus tenderness or frontal sinus tenderness.     Mouth/Throat:     Pharynx: Uvula midline. No oropharyngeal exudate or posterior oropharyngeal erythema.  Cardiovascular:     Rate and Rhythm: Normal rate and regular rhythm.     Heart sounds: Normal heart sounds, S1 normal and S2 normal. No murmur heard. Pulmonary:     Effort: Pulmonary effort is normal.     Breath sounds: Examination of the right-lower field reveals decreased breath sounds. Examination of the left-lower field reveals decreased breath sounds. Decreased breath sounds present. No wheezing, rhonchi or rales.  Psychiatric:        Behavior: Behavior is cooperative.      UC Treatments / Results  Labs (all labs ordered are listed, but only abnormal results are displayed) Labs Reviewed - No data to display  EKG   Radiology DG Chest 2 View  Result Date: 08/10/2023 CLINICAL DATA:  37 year old female with persistent cough. EXAM: CHEST - 2 VIEW COMPARISON:  Chest radiographs 03/27/2020. FINDINGS: PA and lateral views 1130 hours. Moderate to severe cardiomegaly does not appear significantly changed from 2020 exams. Other mediastinal contours are within normal limits. Visualized tracheal air column is within  normal limits. Stable lung volumes and ventilation with no pneumothorax, pleural effusion, pulmonary edema, or consolidation. There is chronic atelectasis along the left major fissure and retrocardiac. No acute osseous abnormality identified. Negative visible bowel gas. IMPRESSION: Chronic moderate to severe cardiomegaly with no acute cardiopulmonary abnormality. Electronically Signed   By: Odessa Fleming M.D.   On: 08/10/2023 11:35    Procedures Procedures (including critical care time)  Medications Ordered in UC Medications - No data to display  Initial Impression / Assessment and Plan / UC Course  I have reviewed the triage vital signs and the nursing notes.  Pertinent labs & imaging results that were available during my care of the patient were reviewed by me and considered in my medical decision making (see chart for details).     Patient is well-appearing, afebrile, nontoxic, nontachycardic.  Viral testing was deferred as patient has been symptomatic for over a week and this would not change management.  Chest x-ray was obtained that showed stable cardiomegaly without acute cardiopulmonary disease.  Given her prolonged and worsening symptoms will cover for secondary bacterial infection.  She will start on doxycycline 100 mg twice daily for 10 days.  We discussed that she should avoid prolonged sun exposure while on this medication due to photosensitivity.  Can use over-the-counter medications including Mucinex, Flonase, Tylenol.  Recommended that she rest and drink plenty of fluid.  If her symptoms or not improving within a week she is return for reevaluation.  We discussed that if she has any worsening symptoms she needs to be seen immediately.  Strict return precautions  given.  Work excuse note provided.  Final Clinical Impressions(s) / UC Diagnoses   Final diagnoses:  Sinobronchitis  Acute cough     Discharge Instructions      Your x-ray did not show any evidence of pneumonia.  We are  going to cover for sinus/bronchitis given your history.  Start doxycycline 100 mg.  Stay out of the sun while on this medication.  Continue using over-the-counter medication including Flonase, Mucinex, nasal saline/sinus rinses.  I have called in Tessalon for cough.  Make sure that you are resting and drinking plenty of fluids.  If your symptoms are improving within a week please return for reevaluation.  If you have any worsening symptoms including chest pain, shortness of breath, worsening cough, fever, nausea, vomiting you need to be seen immediately.     ED Prescriptions     Medication Sig Dispense Auth. Provider   doxycycline (VIBRAMYCIN) 100 MG capsule Take 1 capsule (100 mg total) by mouth 2 (two) times daily. 20 capsule ,  K, PA-C   benzonatate (TESSALON) 100 MG capsule Take 1 capsule (100 mg total) by mouth every 8 (eight) hours. 21 capsule ,  K, PA-C      PDMP not reviewed this encounter.   Jeani Hawking, PA-C 08/10/23 1200

## 2023-08-14 ENCOUNTER — Other Ambulatory Visit: Payer: Self-pay

## 2023-08-29 ENCOUNTER — Other Ambulatory Visit (HOSPITAL_COMMUNITY): Payer: Self-pay

## 2023-08-29 ENCOUNTER — Other Ambulatory Visit (HOSPITAL_COMMUNITY): Payer: Self-pay | Admitting: Physician Assistant

## 2023-08-31 ENCOUNTER — Other Ambulatory Visit (HOSPITAL_COMMUNITY): Payer: Self-pay

## 2023-09-03 ENCOUNTER — Other Ambulatory Visit (HOSPITAL_COMMUNITY): Payer: Self-pay

## 2023-09-03 MED ORDER — BENZONATATE 100 MG PO CAPS
100.0000 mg | ORAL_CAPSULE | Freq: Three times a day (TID) | ORAL | 0 refills | Status: DC
  Filled 2023-09-03: qty 21, 7d supply, fill #0

## 2023-09-06 ENCOUNTER — Other Ambulatory Visit (HOSPITAL_COMMUNITY): Payer: Self-pay

## 2023-09-09 ENCOUNTER — Encounter: Payer: Self-pay | Admitting: Student

## 2023-10-08 ENCOUNTER — Other Ambulatory Visit (HOSPITAL_COMMUNITY): Payer: Self-pay

## 2023-10-09 ENCOUNTER — Other Ambulatory Visit: Payer: Self-pay

## 2023-10-10 LAB — LAB REPORT - SCANNED
Albumin, Urine POC: 1076.7
Albumin/Creatinine Ratio, Urine, POC: 1680
Creatinine, POC: 64.1 mg/dL
EGFR: 31

## 2023-10-11 ENCOUNTER — Other Ambulatory Visit (HOSPITAL_COMMUNITY): Payer: Self-pay

## 2023-10-11 MED ORDER — HYDRALAZINE HCL 50 MG PO TABS
50.0000 mg | ORAL_TABLET | Freq: Every day | ORAL | 6 refills | Status: DC
  Filled 2023-10-11: qty 30, 30d supply, fill #0

## 2023-10-11 MED ORDER — HYDRALAZINE HCL 50 MG PO TABS
50.0000 mg | ORAL_TABLET | Freq: Every day | ORAL | 6 refills | Status: DC
  Filled 2023-10-11: qty 30, 30d supply, fill #0
  Filled 2023-11-01: qty 30, 30d supply, fill #1

## 2023-10-11 MED ORDER — CHLORTHALIDONE 25 MG PO TABS
12.5000 mg | ORAL_TABLET | Freq: Every day | ORAL | 3 refills | Status: DC
  Filled 2023-10-11: qty 15, 30d supply, fill #0
  Filled 2023-11-01 – 2023-11-10 (×2): qty 15, 30d supply, fill #1
  Filled 2023-12-13: qty 15, 30d supply, fill #2

## 2023-10-11 MED ORDER — SPIRONOLACTONE 25 MG PO TABS
25.0000 mg | ORAL_TABLET | Freq: Every day | ORAL | 3 refills | Status: DC
  Filled 2023-10-11: qty 30, 30d supply, fill #0
  Filled 2023-11-01 – 2023-11-10 (×2): qty 30, 30d supply, fill #1
  Filled 2023-12-13: qty 30, 30d supply, fill #2

## 2023-10-15 ENCOUNTER — Ambulatory Visit (INDEPENDENT_AMBULATORY_CARE_PROVIDER_SITE_OTHER): Payer: Managed Care, Other (non HMO) | Admitting: Neurology

## 2023-10-15 DIAGNOSIS — I5032 Chronic diastolic (congestive) heart failure: Secondary | ICD-10-CM

## 2023-10-15 DIAGNOSIS — R0683 Snoring: Secondary | ICD-10-CM

## 2023-10-15 DIAGNOSIS — Z9189 Other specified personal risk factors, not elsewhere classified: Secondary | ICD-10-CM

## 2023-10-15 DIAGNOSIS — R519 Headache, unspecified: Secondary | ICD-10-CM

## 2023-10-15 DIAGNOSIS — R351 Nocturia: Secondary | ICD-10-CM

## 2023-10-15 DIAGNOSIS — G478 Other sleep disorders: Secondary | ICD-10-CM

## 2023-10-16 NOTE — Addendum Note (Signed)
Addended by: Huston Foley on: 10/16/2023 06:29 PM   Modules accepted: Orders

## 2023-10-16 NOTE — Procedures (Signed)
Physician Interpretation:     [facility] POLYSOMNOGRAPHY  INTERPRETATION REPORT   STUDY DATE:  10/15/2023     PATIENT NAME:  Robin May         DATE OF BIRTH:  February 12, 1986  PATIENT ID:  [id number]    TYPE OF STUDY:  PSG  READING PHYSICIAN: Huston Foley, MD, PhD   SCORING TECHNICIAN: Margaretann Loveless, RPSGT   Referred by: Darral Dash, DO  ? History and Indication for Testing: 37 year old female with an underlying medical history of hypertension, hyperlipidemia, chronic kidney disease, allergies, left ventricular hypertrophy, congestive heart failure, and severe obesity with a BMI of over 45, who reports snoring and some daytime somnolence. Sleep is interrupted. Her Epworth sleepiness score is 5/24, fatigue severity score is 19/63.  Height: 63 in Weight: 273 lb (BMI 48) Neck Size: 18 in    MEDICATIONS: Lipitor, Coreg, Farxiga, Flonase, Apresoline, Isordil, Entresto, Sodium Chloride   TECHNICAL DESCRIPTION: A registered sleep technologist ( RPSGT)  was in attendance for the duration of the recording.  Data collection, scoring, video monitoring, and reporting were performed in compliance with the AASM Manual for the Scoring of Sleep and Associated Events; (Hypopnea is scored based on the criteria listed in Section VIII D. 1b in the AASM Manual V2.6 using a 4% oxygen desaturation rule or Hypopnea is scored based on the criteria listed in Section VIII D. 1a in the AASM Manual V2.6 using 3% oxygen desaturation and /or arousal rule).  SLEEP CONTINUITY AND SLEEP ARCHITECTURE:  Lights-out was at 22:14: and lights-on at  04:40:, with a total recording time of 6 hours, 26.5 min. Total sleep time ( TST) was 60.5 minutes with a severely decreased sleep efficiency at 15.7%. There was  0.0% REM sleep.    BODY POSITION:  TST was divided  between the following sleep positions: 0.0% supine;  89.3% lateral;  11% prone. Duration of total sleep and percent of total sleep in  their respective position is as follows: supine 00 minutes (0%), non-supine 61 minutes (100%); right 54 minutes (89%), left 00 minutes (0%), and prone 06 minutes (11%).  Total supine REM sleep time was 00 minutes (0% of total REM sleep).  Sleep latency was increased at 63.0 minutes.  Of the total sleep time, the percentage of stage N1 sleep was 21.5%, stage N2 sleep was 79%, both markedly increased, stage N3 sleep and REM sleep were absent. Wake after sleep onset (WASO) time accounted for 263 minutes, with severe sleep fragmentation noted.   RESPIRATORY MONITORING:   Based on CMS criteria (using a 4% oxygen desaturation rule for scoring hypopneas), there were 21 apneas (21 obstructive; 0 central; 0 mixed), and 46 hypopneas.  Apnea index was 20.8. Hypopnea index was 45.6. The apnea-hypopnea index was 66.4 overall (0.0 supine, 0 non-supine; 0.0 REM, 0.0 supine REM).  There were 0 respiratory effort-related arousals (RERAs).  The RERA index was 0 events/h. Total respiratory disturbance index (RDI) was 66.4 events/h. RDI results showed: supine RDI  0.0 /h; non-supine RDI 66.4 /h; REM RDI 0.0 /h, supine REM RDI 0.0 /h.   Based on AASM criteria (using a 3% oxygen desaturation and /or arousal rule for scoring hypopneas), there were 21 apneas (21 obstructive; 0 central; 0 mixed), and 53 hypopneas. Apnea index was 20.8. Hypopnea index was 52.6. The apnea-hypopnea index was 73.4 overall (0.0 supine, 0 non-supine; 0.0 REM, 0.0 supine REM).  There were 0 respiratory effort-related arousals (RERAs).  The RERA index was 0  events/h. Total respiratory disturbance index (RDI) was 73.4 events/h. RDI results showed: supine RDI  0.0 /h; non-supine RDI 73.4 /h; REM RDI 0.0 /h, supine REM RDI 0.0 /h.   OXIMETRY: Oxyhemoglobin Saturation Nadir during sleep was at  88%) from a mean of 96%.  Of the Total sleep time (TST)   hypoxemia (=<88%) was present for  0.2 minutes, or 0.3% of total sleep time.    LIMB MOVEMENTS: There were  0 periodic limb movements of sleep (0.0/hr), of which 0 (0.0/hr) were associated with an arousal.   AROUSAL: There were 18 arousals in total, for an arousal index of 18 arousals/hour.  Of these, 18 were identified as respiratory-related arousals (18 /h), 0 were PLM-related arousals (0 /h), and 12 were non-specific arousals (12 /h).   EEG: Review of the EEG showed no abnormal electrical discharges and symmetrical bihemispheric findings.    EKG: The EKG revealed normal sinus rhythm (NSR). The average heart rate during sleep was 70 bpm.   AUDIO/VIDEO REVIEW: The audio and video review did not show any abnormal or unusual behaviors, movements, phonations or vocalizations. The patient took 2 restroom breaks. Snoring was noted, intermittently, in the mild range.  POST-STUDY QUESTIONNAIRE: Post study, the patient indicated, that sleep was worse than usual.   IMPRESSION:  1. Poor sleep pattern 2. Inconclusive Test  RECOMMENDATIONS:  1. This study does not show enough sleep data to make a definitive diagnosis (total sleep time of less than 2 hours, namely 60.5 min).  There was evidence of sleep disordered breathing.  Given the poor sleep consolidation, absence of REM sleep and absence of supine sleep, a reliable sleep apnea diagnosis cannot be made.  Given the patient's symptoms and some evidence of sleep disordered breathing during the limited data in this test, the patient will be advised to proceed with a repeat study at home (i.e. home sleep test). 2. This study shows significant sleep fragmentation and significantly abnormal sleep stage percentages; these are nonspecific findings and per se do not signify an intrinsic sleep disorder or a cause for the patient's sleep-related symptoms. Causes include (but are not limited to) the first night effect of the sleep study, circadian rhythm disturbances, medication effect or an underlying mood disorder or medical problem.  3. The patient should be cautioned  not to drive, work at heights, or operate dangerous or heavy equipment when tired or sleepy. Review and reiteration of good sleep hygiene measures should be pursued with any patient. 4. The patient and his referring provider will be notified of the test results.  I certify that I have reviewed the entire raw data recording prior to the issuance of this report in accordance with the Standards of Accreditation of the American Academy of Sleep Medicine (AASM).  Huston Foley, MD, PhD Medical Director, [facility]) Diplomat, ABPN (Neurology and Sleep)              Technical Report:   General Information  Name: Robin May, Robin May BMI: 48.36 Physician: Huston Foley, MD  ID: [id number] Height: 63.0 in Technician: Margaretann Loveless, RPSGT  Sex: Female Weight: 273.0 lb Record: [id number]  Age: 60 [06-30-1986] Date: 10/15/2023    Medical & Medication History    37 year old female with an underlying medical history of hypertension, hyperlipidemia, chronic kidney disease, allergies, left ventricular hypertrophy, congestive heart failure, and severe obesity with a BMI of over 45, who reports snoring and some daytime somnolence. Sleep is interrupted. She reports that her cardiologist  encouraged her to have a sleep study. She had a consultation with a sleep provider about a year ago at [facility] but did not end up having a sleep study. Lipitor, Coreg, Farxiga, Flonase, Apresoline, Isordil, Entresto, Sodium Chloride   Sleep Disorder      Comments   The patient came into the sleep lab for a PSG. The patient slept lateral and prone. The patient had very fragmented sleep. Sleep efficiency was 15.65%. The patient stated this was normal sleep for her. The patient had two restroom breaks. Mild snoring. Respiratory events scored with a 3% desat. The patient did not reach 2 hrs of TST. Study was ended early due to the patient not sleeping.     Lights out: 10:14:21 PM  Lights on: 04:40:56 AM   Time Total Supine Side Prone Upright  Recording (TRT) 6h 26.25m 0h 1.58m 3h 36.58m 2h 49.9m 0h 0.20m  Sleep (TST) 1h 0.24m 0h 0.65m 0h 54.51m 0h 6.49m 0h 0.58m   Latency N1 N2 N3 REM Onset Per. Slp. Eff.  Actual 0h 0.19m 0h 52.78m 0h 0.63m 0h 0.61m 1h 3.40m 2h 12.58m 15.65%   Stg Dur Wake N1 N2 N3 REM  Total 326.0 13.0 47.5 0.0 0.0  Supine 1.0 0.0 0.0 0.0 0.0  Side 162.5 6.5 47.5 0.0 0.0  Prone 162.5 6.5 0.0 0.0 0.0  Upright 0.0 0.0 0.0 0.0 0.0   Stg % Wake N1 N2 N3 REM  Total 84.3 21.5 78.5 0.0 0.0  Supine 0.3 0.0 0.0 0.0 0.0  Side 42.0 10.7 78.5 0.0 0.0  Prone 42.0 10.7 0.0 0.0 0.0  Upright 0.0 0.0 0.0 0.0 0.0     Apnea Summary Sub Supine Side Prone Upright  Total 21 Total 21 0 21 0 0    REM 0 0 0 0 0    NREM 21 0 21 0 0  Obs 21 REM 0 0 0 0 0    NREM 21 0 21 0 0  Mix 0 REM 0 0 0 0 0    NREM 0 0 0 0 0  Cen 0 REM 0 0 0 0 0    NREM 0 0 0 0 0   Rera Summary Sub Supine Side Prone Upright  Total 0 Total 0 0 0 0 0    REM 0 0 0 0 0    NREM 0 0 0 0 0   Hypopnea Summary Sub Supine Side Prone Upright  Total 53 Total 53 0 53 0 0    REM 0 0 0 0 0    NREM 53 0 53 0 0   4% Hypopnea Summary Sub Supine Side Prone Upright  Total (4%) 46 Total 46 0 46 0 0    REM 0 0 0 0 0    NREM 46 0 46 0 0     AHI Total Obs Mix Cen  73.39 Apnea 20.83 20.83 0.00 0.00   Hypopnea 52.56 -- -- --  66.45 Hypopnea (4%) 45.62 -- -- --    Total Supine Side Prone Upright  Position AHI 73.39 0.00 82.22 0.00 0.00  REM AHI 0.00   NREM AHI 73.39   Position RDI 73.39 0.00 82.22 0.00 0.00  REM RDI 0.00   NREM RDI 73.39    4% Hypopnea Total Supine Side Prone Upright  Position AHI (4%) 66.45 0.00 74.44 0.00 0.00  REM AHI (4%) 0.00   NREM AHI (4%) 66.45   Position RDI (4%) 66.45 0.00 74.44 0.00 0.00  REM  RDI (4%) 0.00   NREM RDI (4%) 66.45    Desaturation Information Threshold: 2% <100% <90% <80% <70% <60% <50% <40%  Supine 0.0 0.0 0.0 0.0 0.0 0.0 0.0  Side 185.0 8.0 0.0 0.0 0.0 0.0  0.0  Prone 133.0 1.0 0.0 0.0 0.0 0.0 0.0  Upright 0.0 0.0 0.0 0.0 0.0 0.0 0.0  Total 318.0 9.0 0.0 0.0 0.0 0.0 0.0  Index 76.0 2.2 0.0 0.0 0.0 0.0 0.0   Threshold: 3% <100% <90% <80% <70% <60% <50% <40%  Supine 0.0 0.0 0.0 0.0 0.0 0.0 0.0  Side 153.0 8.0 0.0 0.0 0.0 0.0 0.0  Prone 76.0 1.0 0.0 0.0 0.0 0.0 0.0  Upright 0.0 0.0 0.0 0.0 0.0 0.0 0.0  Total 229.0 9.0 0.0 0.0 0.0 0.0 0.0  Index 54.7 2.2 0.0 0.0 0.0 0.0 0.0   Threshold: 4% <100% <90% <80% <70% <60% <50% <40%  Supine 0.0 0.0 0.0 0.0 0.0 0.0 0.0  Side 109.0 8.0 0.0 0.0 0.0 0.0 0.0  Prone 39.0 1.0 0.0 0.0 0.0 0.0 0.0  Upright 0.0 0.0 0.0 0.0 0.0 0.0 0.0  Total 148.0 9.0 0.0 0.0 0.0 0.0 0.0  Index 35.4 2.2 0.0 0.0 0.0 0.0 0.0   Threshold: 3% <100% <90% <80% <70% <60% <50% <40%  Supine 0 0 0 0 0 0 0  Side 153 8 0 0 0 0 0  Prone 76 1 0 0 0 0 0  Upright 0 0 0 0 0 0 0  Total 229 9 0 0 0 0 0   Awakening/Arousal Information # of Awakenings 17  Wake after sleep onset 263.55m  Wake after persistent sleep 208.12m   Arousal Assoc. Arousals Index  Apneas 3 3.0  Hypopneas 16 15.9  Leg Movements 0 0.0  Snore 0 0.0  PTT Arousals 0 0.0  Spontaneous 12 11.9  Total 31 30.7  Leg Movement Information PLMS LMs Index  Total LMs during PLMS 0 0.0  LMs w/ Microarousals 0 0.0   LM LMs Index  w/ Microarousal 0 0.0  w/ Awakening 0 0.0  w/ Resp Event 0 0.0  Spontaneous 1 1.0  Total 1 1.0     Desaturation threshold setting: 3% Minimum desaturation setting: 10 seconds SaO2 nadir: 55% The longest event was a 24 sec obstructive Hypopnea with a minimum SaO2 of 90%. The lowest SaO2 was 88% associated with a 10 sec obstructive Apnea. EKG Rates EKG Avg Max Min  Awake 74 88 67  Asleep 70 75 67  EKG Events: N/A

## 2023-10-21 ENCOUNTER — Telehealth: Payer: Self-pay

## 2023-10-21 NOTE — Telephone Encounter (Signed)
IF PT RETURNS CALL, ROUTE TO POD 4   Contacted pt, LVM rq call back. Will send [facility] msg.

## 2023-10-21 NOTE — Telephone Encounter (Signed)
-----   Message from Robin May sent at 10/16/2023  6:29 PM EDT ----- Patient referred by PCP, seen by me on 07/01/23, patient had a diagnostic PSG on 10/15/23.   Please call and notify the patient that the recent sleep study did not show enough sleep data to make a reliable/definitive diagnosis. Unfortunately, she slept less than 2 hours (about 60 min). I recommend repeat testing at home with a HST.  I placed the order in the chart and we will seek insurance approval, then get in touch with her to pursue testing at home.  Thanks,  Robin Foley, MD, PhD [facility])

## 2023-10-29 ENCOUNTER — Ambulatory Visit: Payer: Managed Care, Other (non HMO) | Attending: Cardiology | Admitting: Cardiology

## 2023-10-29 VITALS — BP 142/96 | HR 76 | Resp 16 | Ht 63.0 in | Wt 265.0 lb

## 2023-10-29 DIAGNOSIS — Z6841 Body Mass Index (BMI) 40.0 and over, adult: Secondary | ICD-10-CM

## 2023-10-29 DIAGNOSIS — I5032 Chronic diastolic (congestive) heart failure: Secondary | ICD-10-CM

## 2023-10-29 DIAGNOSIS — Z87891 Personal history of nicotine dependence: Secondary | ICD-10-CM

## 2023-10-29 DIAGNOSIS — E782 Mixed hyperlipidemia: Secondary | ICD-10-CM

## 2023-10-29 DIAGNOSIS — I10 Essential (primary) hypertension: Secondary | ICD-10-CM

## 2023-10-29 DIAGNOSIS — E66813 Obesity, class 3: Secondary | ICD-10-CM

## 2023-10-29 NOTE — Patient Instructions (Addendum)
Medication Instructions:  Your physician recommends that you continue on your current medications as directed. Please refer to the Current Medication list given to you today.  *If you need a refill on your cardiac medications before your next appointment, please call your pharmacy*  Lab Work: None ordered today.  Testing/Procedures: Your physician recommends you have a cardiac MRI performed. A scheduler from [facility] will call you to schedule an appointment for this test.  Follow-Up: At [facility], you and your health needs are our priority.  As part of our continuing mission to provide you with exceptional heart care, we have created designated Provider Care Teams.  These Care Teams include your primary Cardiologist (physician) and Advanced Practice Providers (APPs -  Physician Assistants and Nurse Practitioners) who all work together to provide you with the care you need, when you need it.  We recommend signing up for the patient portal called "MyChart".  Sign up information is provided on this After Visit Summary.  MyChart is used to connect with patients for Virtual Visits (Telemedicine).  Patients are able to view lab/test results, encounter notes, upcoming appointments, etc.  Non-urgent messages can be sent to your provider as well.   To learn more about what you can do with MyChart, go to [email or web address].    Your next appointment:   6 month(s)  The format for your next appointment:   In Person  Provider:   Tessa Lerner, DO {  Other Instructions    You are scheduled for Cardiac MRI on TBD. Please arrive for your appointment at TBD ( arrive 30-45 minutes prior to test start time). ?   [facility] [address] [place], [place] [address] [phone] Please take advantage of the free valet parking available at the [facility] and [employer] (Entrance C).  Proceed to the [facility] Radiology Department (First Floor) for check-in.     Magnetic resonance imaging (MRI) is a painless test that produces images of the inside of the body without using Xrays.   During an MRI, strong magnets and radio waves work together in a Data processing manager to form detailed images.    MRI images may provide more details about a medical condition than X-rays, CT scans, and ultrasounds can provide.   You may be given earphones to listen for instructions.   You may eat a light breakfast and take medications as ordered with the exception of furosemide, hydrochlorothiazide, or spironolactone(fluid pill, other). Please avoid stimulants for 12 hr prior to test. (Ie. Caffeine, nicotine, chocolate, or antihistamine medications)   If a contrast material will be used, an IV will be inserted into one of your veins. Contrast material will be injected into your IV. It will leave your body through your urine within a day. You may be told to drink plenty of fluids to help flush the contrast material out of your system.   You will be asked to remove all metal, including: Watch, jewelry, and other metal objects including hearing aids, hair pieces and dentures. Also wearable glucose monitoring systems (ie. Freestyle Libre and Omnipods) (Braces and fillings normally are not a problem.)   TEST WILL TAKE APPROXIMATELY 1 HOUR   PLEASE NOTIFY SCHEDULING AT LEAST 24 HOURS IN ADVANCE IF YOU ARE UNABLE TO KEEP YOUR APPOINTMENT. [phone]   For more information and frequently asked questions, please visit our website : [email or web address]   Please call Rockwell Alexandria, cardiac imaging nurse navigator with any questions/concerns. Rockwell Alexandria RN Navigator Cardiac Imaging  Larey Brick RN Navigator Cardiac Imaging [facility] [phone] Office

## 2023-10-30 ENCOUNTER — Other Ambulatory Visit (HOSPITAL_COMMUNITY): Payer: Self-pay

## 2023-11-01 ENCOUNTER — Other Ambulatory Visit (HOSPITAL_COMMUNITY): Payer: Self-pay

## 2023-11-01 ENCOUNTER — Other Ambulatory Visit: Payer: Self-pay

## 2023-11-01 ENCOUNTER — Other Ambulatory Visit: Payer: Self-pay | Admitting: Student

## 2023-11-01 DIAGNOSIS — E782 Mixed hyperlipidemia: Secondary | ICD-10-CM

## 2023-11-01 DIAGNOSIS — I5032 Chronic diastolic (congestive) heart failure: Secondary | ICD-10-CM

## 2023-11-01 MED ORDER — ATORVASTATIN CALCIUM 40 MG PO TABS
40.0000 mg | ORAL_TABLET | Freq: Every day | ORAL | 3 refills | Status: AC
  Filled 2023-11-01: qty 90, 90d supply, fill #0
  Filled 2024-05-28: qty 90, 90d supply, fill #1

## 2023-11-01 MED ORDER — CARVEDILOL 25 MG PO TABS
37.5000 mg | ORAL_TABLET | Freq: Two times a day (BID) | ORAL | 3 refills | Status: AC
  Filled 2023-11-01: qty 90, 30d supply, fill #0
  Filled 2023-12-13: qty 90, 30d supply, fill #1
  Filled 2024-08-08: qty 90, 30d supply, fill #2

## 2023-11-02 ENCOUNTER — Encounter: Payer: Self-pay | Admitting: Cardiology

## 2023-11-04 ENCOUNTER — Ambulatory Visit: Payer: Managed Care, Other (non HMO) | Admitting: Neurology

## 2023-11-04 ENCOUNTER — Other Ambulatory Visit (HOSPITAL_COMMUNITY): Payer: Self-pay

## 2023-11-04 DIAGNOSIS — Z9189 Other specified personal risk factors, not elsewhere classified: Secondary | ICD-10-CM

## 2023-11-04 DIAGNOSIS — R351 Nocturia: Secondary | ICD-10-CM

## 2023-11-04 DIAGNOSIS — I5032 Chronic diastolic (congestive) heart failure: Secondary | ICD-10-CM

## 2023-11-04 DIAGNOSIS — G4733 Obstructive sleep apnea (adult) (pediatric): Secondary | ICD-10-CM

## 2023-11-04 DIAGNOSIS — R0683 Snoring: Secondary | ICD-10-CM

## 2023-11-04 DIAGNOSIS — R519 Headache, unspecified: Secondary | ICD-10-CM

## 2023-11-04 DIAGNOSIS — G478 Other sleep disorders: Secondary | ICD-10-CM

## 2023-11-11 ENCOUNTER — Other Ambulatory Visit (HOSPITAL_COMMUNITY): Payer: Self-pay

## 2023-11-14 NOTE — Progress Notes (Signed)
See procedure note.

## 2023-11-16 ENCOUNTER — Other Ambulatory Visit (HOSPITAL_COMMUNITY): Payer: Self-pay

## 2023-11-19 ENCOUNTER — Telehealth: Payer: Self-pay | Admitting: *Deleted

## 2023-11-19 NOTE — Telephone Encounter (Signed)
Spoke to patient gave sleep study results . Pt aware she is severe OSA . Pt chose [facility] for DME . Gave [facility] #  Pt aware of insurance compliance.  Sent orders to [facility] this afternoon and sent sleep study results to PCP Made pt f/u appointment with Jessica,NP 02/2024 Pt expressed understanding and thanked me foir calling

## 2023-11-19 NOTE — Telephone Encounter (Signed)
-----   Message from Huston Foley sent at 11/19/2023  2:40 PM EST ----- Urgent set up requested on PAP therapy, due to severe OSA. Patient referred by PCP, seen by me on 07/01/2023.  She had an attempted laboratory study on 10/15/2023 but did not achieve enough sleep.  She had a home sleep test on 11/09/2023.  Please call and notify the patient that the recent home sleep test showed obstructive sleep apnea in the severe range. I recommend treatment for this in the form of autoPAP, which means, that we don't have to bring her in for a sleep study with CPAP, but will let her start using a so called autoPAP machine at home, through a DME company (of her choice, or as per insurance requirement). The DME representative will fit the patient with a mask of choice, educate her on how to use the machine, how to put the mask on, etc. I have placed an order in the chart. Please send the order to a local DME, talk to patient, send report to referring MD. Please also reinforce the need for compliance with treatment. We will need a FU in sleep clinic for 10 weeks post-PAP set up, please arrange that with me or one of our NPs. Thanks,   Huston Foley, MD, PhD [facility])

## 2023-11-19 NOTE — Procedures (Signed)
[facility]  HOME SLEEP TEST (Watch PAT) REPORT -   Mail-out Device  STUDY DATE: 11/09/2023  DOB: July 05, 1986  MRN: [id number]  ORDERING CLINICIAN: Huston Foley, MD, PhD   REFERRING CLINICIAN: Darral Dash, DO   CLINICAL INFORMATION/HISTORY: 37 year old female with an underlying medical history of hypertension, hyperlipidemia, chronic kidney disease, allergies, left ventricular hypertrophy, congestive heart failure, and severe obesity with a BMI of over 45, who reports snoring and some daytime somnolence.  She had an attempted laboratory sleep study on 10/15/2023 but did not achieve enough sleep.  She presents for a home sleep test.  Epworth sleepiness score: 5/24.  BMI: 48 kg/m  FINDINGS:   Sleep Summary:   Total Recording Time (hours, min): 8 hours, 10 min  Total Sleep Time (hours, min):  6 hours, 50 min  Percent REM (%):    28.3%   Respiratory Indices:   Calculated pAHI (per hour):  31.6/hour         REM pAHI:    55.3/hour       NREM pAHI: 21.9/hour  Central pAHI: 0.9/hour  Oxygen Saturation Statistics:    Oxygen Saturation (%) Mean: 95%   Minimum oxygen saturation (%):                 66%   O2 Saturation Range (%): 66 - 99%    O2 Saturation (minutes) <=88%: 0.4 min  Pulse Rate Statistics:   Pulse Mean (bpm):    73/min    Pulse Range (61 - 101/min)   IMPRESSION: OSA (obstructive sleep apnea), severe  RECOMMENDATION:  This home sleep test demonstrates severe obstructive sleep apnea with a total AHI of 31.6/hour and O2 nadir of 66%, snoring was in mild to moderate range, at times intermittent. Treatment with positive airway pressure is highly recommended. The patient will be advised to proceed with an autoPAP titration/trial at home. A laboratory attended titration study can be considered in the future for optimization of treatment settings and to improve tolerance and compliance, if needed, down the road. Alternative treatment  options are limited secondary to the severity of the patient's sleep disordered breathing, but may include surgical treatment with an implantable hypoglossal nerve stimulator (in carefully selected candidates, meeting criteria).  Concomitant weight loss is recommended (where clinically appropriate). Please note, that untreated obstructive sleep apnea may carry additional perioperative morbidity. Patients with significant obstructive sleep apnea should receive perioperative PAP therapy and the surgeons and particularly the anesthesiologist should be informed of the diagnosis and the severity of the sleep disordered breathing. The patient should be cautioned not to drive, work at heights, or operate dangerous or heavy equipment when tired or sleepy. Review and reiteration of good sleep hygiene measures should be pursued with any patient. Other causes of the patient's symptoms, including circadian rhythm disturbances, an underlying mood disorder, medication effect and/or an underlying medical problem cannot be ruled out based on this test. Clinical correlation is recommended.  The patient and her referring provider will be notified of the test results. The patient will be seen in follow up in sleep clinic at [facility].  I certify that I have reviewed the raw data recording prior to the issuance of this report in accordance with the standards of the American Academy of Sleep Medicine (AASM).    INTERPRETING PHYSICIAN:   Huston Foley, MD, PhD Medical Director, [facility]) Diplomat, ABPN (Neurology and Sleep)   [facility] [address], [address] [place], [place]  [address] [phone]                     

## 2023-11-19 NOTE — Addendum Note (Signed)
Addended by: Huston Foley on: 11/19/2023 02:40 PM   Modules accepted: Orders

## 2023-12-13 ENCOUNTER — Other Ambulatory Visit (HOSPITAL_COMMUNITY): Payer: Self-pay

## 2023-12-13 ENCOUNTER — Other Ambulatory Visit: Payer: Self-pay

## 2023-12-13 ENCOUNTER — Other Ambulatory Visit: Payer: Self-pay | Admitting: Student

## 2023-12-13 MED ORDER — DAPAGLIFLOZIN PROPANEDIOL 10 MG PO TABS
10.0000 mg | ORAL_TABLET | Freq: Every day | ORAL | 3 refills | Status: DC
  Filled 2023-12-13 – 2024-01-28 (×3): qty 30, 30d supply, fill #0
  Filled 2024-04-14: qty 30, 30d supply, fill #1
  Filled 2024-05-28: qty 30, 30d supply, fill #2
  Filled 2024-08-08: qty 30, 30d supply, fill #3

## 2023-12-24 ENCOUNTER — Other Ambulatory Visit (HOSPITAL_COMMUNITY): Payer: Self-pay

## 2024-01-10 ENCOUNTER — Other Ambulatory Visit (HOSPITAL_COMMUNITY): Payer: Self-pay

## 2024-01-10 MED ORDER — CHLORTHALIDONE 25 MG PO TABS
25.0000 mg | ORAL_TABLET | Freq: Every day | ORAL | 3 refills | Status: AC
Start: 2024-01-10 — End: ?
  Filled 2024-01-10 – 2024-01-16 (×2): qty 90, 90d supply, fill #0
  Filled 2024-05-28: qty 90, 90d supply, fill #1

## 2024-01-10 MED ORDER — SPIRONOLACTONE 50 MG PO TABS
50.0000 mg | ORAL_TABLET | Freq: Every day | ORAL | 3 refills | Status: AC
  Filled 2024-01-10: qty 90, 90d supply, fill #0
  Filled 2024-05-28: qty 90, 90d supply, fill #1

## 2024-01-16 ENCOUNTER — Other Ambulatory Visit: Payer: Self-pay

## 2024-01-16 ENCOUNTER — Other Ambulatory Visit (HOSPITAL_COMMUNITY): Payer: Self-pay

## 2024-01-17 ENCOUNTER — Other Ambulatory Visit (HOSPITAL_COMMUNITY): Payer: Self-pay

## 2024-01-20 ENCOUNTER — Other Ambulatory Visit (HOSPITAL_COMMUNITY): Payer: Self-pay

## 2024-01-27 ENCOUNTER — Other Ambulatory Visit (HOSPITAL_COMMUNITY): Payer: Self-pay

## 2024-01-28 ENCOUNTER — Other Ambulatory Visit (HOSPITAL_COMMUNITY): Payer: Self-pay

## 2024-02-10 NOTE — Progress Notes (Signed)
[facility] [address] [place]. [place] [address] [phone]       OFFICE FOLLOW UP NOTE  Ms. Gwenith Spitz Ellzey Date of Birth:  08-14-1986 Medical Record Number:  [id number]    Primary neurologist: Dr. Frances Furbish Reason for visit: Initial CPAP follow-up    SUBJECTIVE:   CHIEF COMPLAINT:  Chief Complaint  Patient presents with   Follow-up    Pt alone, rm 3. Here for CPAP follow up. DME [facility]. It has taken a little to getting used to but overall doing well.    Follow-up visit:   Brief HPI:   ANJELIKA AUSBURN is a 38 y.o. female who was evaluated by Dr. Frances Furbish in 07/2023 for concern of underlying sleep apnea with PMH of hypertension, hyperlipidemia, chronic kidney disease, allergies, left ventricular hypertrophy, congestive heart failure, and severe obesity with a BMI of over 45, who reports snoring and some daytime somnolence.  ESS 5/24.  HST 11/2023 showed severe sleep apnea with total AHI of 31.6/h and O2 nadir of 66%.  AutoPap initiated 12/2023.    Interval history:  Compliance report as below showing optimal usage and optimal residual AHI.  Reports tolerance gradually improving.  Currently using fullface mask.  Does report sleeping better and improvement of daytime energy levels.  More recent difficulty using consistently due to nasal congestion.  ESS 4/24 (prior to CPAP 5/24). DME [facility].          ROS:   14 system review of systems performed and negative with exception of those listed in HPI  PMH:  Past Medical History:  Diagnosis Date   Allergy    Anginal pain (HCC) 08/18/2012   "mild"   CHF (congestive heart failure) (HCC)    Heart failure (HCC) 2016   Hyperlipidemia    Hypertension    Shortness of breath 08/19/2012   "w/exertion and w/lying down"    PSH:  Past Surgical History:  Procedure Laterality Date   NO PAST SURGERIES      Social History:  Social History   Socioeconomic History   Marital status: Single    Spouse  name: n/a   Number of children: 0   Years of education: Associate's Degree   Highest education level: Associate degree: occupational, Scientist, product/process development, or vocational program  Occupational History   Occupation: Runner, broadcasting/film/video    Comment: pre-K  Tobacco Use   Smoking status: Former    Types: Cigars    Quit date: 12/31/2005    Years since quitting: 18.1   Smokeless tobacco: Never   Tobacco comments:    08/19/2012 "smoked black and milds"  Vaping Use   Vaping status: Never Used  Substance and Sexual Activity   Alcohol use: Never   Drug use: No   Sexual activity: Not Currently    Comment: Last sexual activity 02/2012.  Other Topics Concern   Not on file  Social History Narrative   Lives with: mother   Exercise: gym three days per week (elliptical, treadmill, weights).   Social Drivers of Health   Financial Resource Strain: Medium Risk (05/07/2023)   Overall Financial Resource Strain (CARDIA)    Difficulty of Paying Living Expenses: Somewhat hard  Food Insecurity: No Food Insecurity (05/07/2023)   Hunger Vital Sign    Worried About Running Out of Food in the Last Year: Never true    Ran Out of Food in the Last Year: Never true  Transportation Needs: No Transportation Needs (05/07/2023)   PRAPARE - Transportation    Lack of  Transportation (Medical): No    Lack of Transportation (Non-Medical): No  Physical Activity: Insufficiently Active (05/07/2023)   Exercise Vital Sign    Days of Exercise per Week: 3 days    Minutes of Exercise per Session: 30 min  Stress: No Stress Concern Present (05/07/2023)   [employer] of Occupational Health - Occupational Stress Questionnaire    Feeling of Stress : Only a little  Social Connections: Moderately Integrated (05/07/2023)   Social Connection and Isolation Panel [NHANES]    Frequency of Communication with Friends and Family: More than three times a week    Frequency of Social Gatherings with Friends and Family: Once a week    Attends Religious Services: More  than 4 times per year    Active Member of [employer] or Organizations: Yes    Attends Banker Meetings: 1 to 4 times per year    Marital Status: Never married  Catering manager Violence: Not on file    Family History:  Family History  Problem Relation Age of Onset   Hypertension Mother    Thyroid disease Mother    Diabetes Father    Heart failure Sister    Asthma Sister    Asthma Brother    Hypertension Maternal Grandmother    Hypertension Maternal Grandfather    Heart disease Maternal Grandfather    Kidney failure Maternal Grandfather    Hypertension Paternal Grandmother    Hypertension Paternal Grandfather     Medications:   Current Outpatient Medications on File Prior to Visit  Medication Sig Dispense Refill   atorvastatin (LIPITOR) 40 MG tablet Take 1 tablet (40 mg total) by mouth daily. 90 tablet 3   carvedilol (COREG) 25 MG tablet Take 1 and 1/2 tablets (37.5 mg) by mouth 2 times daily with a meal. 90 tablet 3   chlorthalidone (HYGROTON) 25 MG tablet Take 1 tablet (25 mg total) by mouth daily. 90 tablet 3   dapagliflozin propanediol (FARXIGA) 10 MG TABS tablet Take 1 tablet (10 mg total) by mouth daily. 30 tablet 3   fluticasone (FLONASE) 50 MCG/ACT nasal spray Place 2 sprays into both nostrils daily. 16 g 6   hydrALAZINE (APRESOLINE) 50 MG tablet Take 1 tablet (50 mg total) by mouth daily. 30 tablet 6   montelukast (SINGULAIR) 10 MG tablet Take 1 tablet (10 mg total) by mouth at bedtime. 90 tablet 3   sacubitril-valsartan (ENTRESTO) 97-103 MG Take 1 tablet by mouth 2 times daily. 180 tablet 0   sodium chloride (OCEAN) 0.65 % SOLN nasal spray Place 1 spray into both nostrils as needed for congestion. 44 mL 0   spironolactone (ALDACTONE) 50 MG tablet Take 1 tablet (50 mg total) by mouth daily. 90 tablet 3   isosorbide dinitrate (ISORDIL) 20 MG tablet Take 1 tablet by mouth 3 times daily. 90 tablet 2   No current facility-administered medications on file prior to  visit.    Allergies:  No Known Allergies    OBJECTIVE:  Physical Exam  Vitals:   02/11/24 1530  BP: (!) 156/92  Pulse: 86  Weight: 264 lb (119.7 kg)  Height: 5\' 3"  (1.6 m)   Body mass index is 46.77 kg/m. No results found.  General: well developed, well nourished, very pleasant middle-age African-American female, seated, in no evident distress Head: head normocephalic and atraumatic.   Neck: supple with no carotid or supraclavicular bruits Cardiovascular: regular rate and rhythm, no murmurs Musculoskeletal: no deformity Skin:  no rash/petichiae Vascular:  Normal pulses all  extremities   Neurologic Exam Mental Status: Awake and fully alert. Oriented to place and time. Recent and remote memory intact. Attention span, concentration and fund of knowledge appropriate. Mood and affect appropriate.  Cranial Nerves: Pupils equal, briskly reactive to light. Extraocular movements full without nystagmus. Visual fields full to confrontation. Hearing intact. Facial sensation intact. Face, tongue, palate moves normally and symmetrically.  Motor: Normal bulk and tone. Normal strength in all tested extremity muscles Gait and Station: Arises from chair without difficulty. Stance is normal. Gait demonstrates normal stride length and balance         ASSESSMENT/PLAN: BRIGGETTE NAJARIAN is a 38 y.o. year old female    OSA on CPAP : Compliance report shows satisfactory usage and optimal residual AHI.  Continue current pressure settings of 7-13 with EPR 3.  Discussed continued nightly usage with ensuring greater than 4 hours nightly for optimal benefit and per insurance purposes.  Continue to follow with DME company for any needed supplies or CPAP related concerns     Follow up in 6 months or call earlier if needed   CC:  PCP: Darral Dash, DO    I spent 25 minutes of face-to-face and non-face-to-face time with patient.  This included previsit chart review, lab review, study  review, order entry, electronic health record documentation, patient education and discussion regarding above diagnoses and treatment plan and answered all other questions to patient's satisfaction  Ihor Austin, [facility]  [facility] [address] [place], [place] [address]  Phone [phone] Fax [phone] Note: This document was prepared with digital dictation and possible smart phrase technology. Any transcriptional errors that result from this process are unintentional.

## 2024-02-11 ENCOUNTER — Ambulatory Visit: Payer: Commercial Managed Care - HMO | Admitting: Adult Health

## 2024-02-11 ENCOUNTER — Encounter: Payer: Self-pay | Admitting: Adult Health

## 2024-02-11 VITALS — BP 156/92 | HR 86 | Ht 63.0 in | Wt 264.0 lb

## 2024-02-11 DIAGNOSIS — G4733 Obstructive sleep apnea (adult) (pediatric): Secondary | ICD-10-CM

## 2024-02-11 NOTE — Patient Instructions (Addendum)
Your Plan:  Continue nightly use of CPAP to ensure greater than 4 hours per night  Continue to follow with DME [facility] for any needed supplies or CPAP related concerns      Follow up in 6 months or call earlier if needed      Thank you for coming to see [place] at [facility]. I hope we have been able to provide you high quality care today.  You may receive a patient satisfaction survey over the next few weeks. We would appreciate your feedback and comments so that we may continue to improve ourselves and the health of our patients.

## 2024-04-20 ENCOUNTER — Encounter (HOSPITAL_COMMUNITY): Payer: Self-pay

## 2024-04-20 ENCOUNTER — Other Ambulatory Visit: Payer: Self-pay

## 2024-04-20 ENCOUNTER — Encounter: Payer: Self-pay | Admitting: Cardiology

## 2024-04-20 DIAGNOSIS — I5032 Chronic diastolic (congestive) heart failure: Secondary | ICD-10-CM

## 2024-05-28 ENCOUNTER — Encounter: Payer: Self-pay | Admitting: Student

## 2024-06-01 ENCOUNTER — Other Ambulatory Visit: Payer: Self-pay | Admitting: Cardiology

## 2024-06-01 ENCOUNTER — Other Ambulatory Visit (HOSPITAL_COMMUNITY)

## 2024-06-01 ENCOUNTER — Ambulatory Visit (HOSPITAL_COMMUNITY)
Admission: RE | Admit: 2024-06-01 | Discharge: 2024-06-01 | Disposition: A | Discharge status: Home / Self Care | Source: Ambulatory Visit | Attending: Cardiology | Admitting: Cardiology

## 2024-06-01 DIAGNOSIS — I5032 Chronic diastolic (congestive) heart failure: Secondary | ICD-10-CM | POA: Insufficient documentation

## 2024-06-01 MED ORDER — GADOBUTROL 1 MMOL/ML IV SOLN
10.0000 mL | Freq: Once | INTRAVENOUS | Status: AC | PRN
  Administered 2024-06-01: 10 mL via INTRAVENOUS

## 2024-06-05 ENCOUNTER — Ambulatory Visit: Payer: Self-pay | Admitting: Cardiology

## 2024-06-05 DIAGNOSIS — I5030 Unspecified diastolic (congestive) heart failure: Secondary | ICD-10-CM

## 2024-06-05 DIAGNOSIS — I517 Cardiomegaly: Secondary | ICD-10-CM

## 2024-06-05 DIAGNOSIS — D869 Sarcoidosis, unspecified: Secondary | ICD-10-CM

## 2024-06-08 ENCOUNTER — Ambulatory Visit: Attending: Cardiology

## 2024-06-08 ENCOUNTER — Other Ambulatory Visit: Payer: Self-pay | Admitting: Cardiology

## 2024-06-08 DIAGNOSIS — R9431 Abnormal electrocardiogram [ECG] [EKG]: Secondary | ICD-10-CM

## 2024-06-08 DIAGNOSIS — I4510 Unspecified right bundle-branch block: Secondary | ICD-10-CM

## 2024-06-08 DIAGNOSIS — I440 Atrioventricular block, first degree: Secondary | ICD-10-CM

## 2024-06-08 DIAGNOSIS — D869 Sarcoidosis, unspecified: Secondary | ICD-10-CM

## 2024-06-08 DIAGNOSIS — I5030 Unspecified diastolic (congestive) heart failure: Secondary | ICD-10-CM

## 2024-06-08 DIAGNOSIS — I517 Cardiomegaly: Secondary | ICD-10-CM

## 2024-06-08 NOTE — Progress Notes (Unsigned)
 Enrolled for [employer] to mail a ZIO XT long term holter monitor to the patients address on file.

## 2024-06-09 ENCOUNTER — Encounter: Payer: Self-pay | Admitting: *Deleted

## 2024-06-15 ENCOUNTER — Ambulatory Visit (HOSPITAL_COMMUNITY)

## 2024-06-15 ENCOUNTER — Other Ambulatory Visit (HOSPITAL_COMMUNITY)

## 2024-07-14 ENCOUNTER — Encounter (HOSPITAL_COMMUNITY): Payer: Self-pay

## 2024-07-14 ENCOUNTER — Telehealth (HOSPITAL_COMMUNITY): Payer: Self-pay | Admitting: Emergency Medicine

## 2024-07-14 ENCOUNTER — Ambulatory Visit: Payer: Self-pay | Admitting: Cardiology

## 2024-07-14 DIAGNOSIS — R9431 Abnormal electrocardiogram [ECG] [EKG]: Secondary | ICD-10-CM | POA: Diagnosis not present

## 2024-07-14 DIAGNOSIS — I517 Cardiomegaly: Secondary | ICD-10-CM

## 2024-07-14 DIAGNOSIS — I4510 Unspecified right bundle-branch block: Secondary | ICD-10-CM

## 2024-07-14 DIAGNOSIS — I440 Atrioventricular block, first degree: Secondary | ICD-10-CM | POA: Diagnosis not present

## 2024-07-14 NOTE — Telephone Encounter (Signed)
 Reaching out to patient to offer assistance regarding upcoming cardiac imaging study; pt verbalizes understanding of appt date/time, parking situation and where to check in, pre-test NPO status and medications ordered, and verified current allergies; name and call back number provided for further questions should they arise Rockwell Alexandria RN Navigator Cardiac Imaging [facility] [phone] office [phone] cell

## 2024-07-15 ENCOUNTER — Encounter (HOSPITAL_COMMUNITY)

## 2024-07-16 ENCOUNTER — Ambulatory Visit (HOSPITAL_COMMUNITY)
Admission: RE | Admit: 2024-07-16 | Discharge: 2024-07-16 | Disposition: A | Discharge status: Home / Self Care | Source: Ambulatory Visit | Attending: Cardiology | Admitting: Cardiology

## 2024-07-16 DIAGNOSIS — D869 Sarcoidosis, unspecified: Secondary | ICD-10-CM | POA: Diagnosis present

## 2024-07-16 LAB — NM PET CT MYOCARDIAL SARCOIDOSIS
Nuc Stress EF: 35 %
Rest Nuclear Isotope Dose: 30.5 mCi

## 2024-07-16 MED ORDER — FLUDEOXYGLUCOSE F - 18 (FDG) INJECTION
8.8000 | Freq: Once | INTRAVENOUS | Status: AC
  Administered 2024-07-16: 8.8 via INTRAVENOUS

## 2024-07-16 MED ORDER — RUBIDIUM RB82 GENERATOR (RUBYFILL)
32.5200 | PACK | Freq: Once | INTRAVENOUS | Status: AC
  Administered 2024-07-16: 30.52 via INTRAVENOUS

## 2024-07-26 ENCOUNTER — Other Ambulatory Visit: Payer: Self-pay | Admitting: Cardiology

## 2024-07-26 ENCOUNTER — Ambulatory Visit: Payer: Self-pay | Admitting: Cardiology

## 2024-07-26 ENCOUNTER — Other Ambulatory Visit: Payer: Self-pay

## 2024-07-26 DIAGNOSIS — I5042 Chronic combined systolic (congestive) and diastolic (congestive) heart failure: Secondary | ICD-10-CM

## 2024-07-26 DIAGNOSIS — I10 Essential (primary) hypertension: Secondary | ICD-10-CM

## 2024-07-28 ENCOUNTER — Other Ambulatory Visit (HOSPITAL_COMMUNITY): Payer: Self-pay

## 2024-07-28 MED ORDER — ISOSORBIDE DINITRATE 20 MG PO TABS
20.0000 mg | ORAL_TABLET | Freq: Three times a day (TID) | ORAL | 0 refills | Status: AC
  Filled 2024-08-08: qty 90, 30d supply, fill #0

## 2024-07-28 MED ORDER — SACUBITRIL-VALSARTAN 97-103 MG PO TABS
1.0000 | ORAL_TABLET | Freq: Two times a day (BID) | ORAL | 0 refills | Status: AC
  Filled 2024-08-08: qty 180, 90d supply, fill #0

## 2024-08-08 ENCOUNTER — Other Ambulatory Visit (HOSPITAL_COMMUNITY): Payer: Self-pay

## 2024-08-10 ENCOUNTER — Other Ambulatory Visit: Payer: Self-pay

## 2024-08-10 ENCOUNTER — Other Ambulatory Visit (HOSPITAL_COMMUNITY): Payer: Self-pay

## 2024-08-10 ENCOUNTER — Other Ambulatory Visit: Payer: Self-pay | Admitting: Student

## 2024-08-10 MED ORDER — MONTELUKAST SODIUM 10 MG PO TABS
10.0000 mg | ORAL_TABLET | Freq: Every day | ORAL | 3 refills | Status: AC
  Filled 2024-08-10: qty 90, 90d supply, fill #0

## 2024-08-14 ENCOUNTER — Telehealth: Payer: Self-pay | Admitting: Neurology

## 2024-08-14 NOTE — Telephone Encounter (Signed)
 Noted

## 2024-08-14 NOTE — Telephone Encounter (Signed)
 ..  Pt understands that although there may be some limitations with this type of visit, we will take all precautions to reduce any security or privacy concerns.  Pt understands that this will be treated like an in office visit and we will file with pt's insurance, and there may be a patient responsible charge related to this service. ? ?

## 2024-08-18 ENCOUNTER — Ambulatory Visit: Payer: Commercial Managed Care - HMO | Admitting: Adult Health

## 2024-08-25 ENCOUNTER — Other Ambulatory Visit: Payer: Self-pay | Admitting: Family Medicine

## 2024-08-25 ENCOUNTER — Other Ambulatory Visit (HOSPITAL_COMMUNITY): Payer: Self-pay

## 2024-08-25 MED ORDER — FLUTICASONE PROPIONATE 50 MCG/ACT NA SUSP
2.0000 | Freq: Every day | NASAL | 6 refills | Status: AC
  Filled 2024-08-25 – 2024-12-25 (×2): qty 16, 30d supply, fill #0

## 2024-09-02 ENCOUNTER — Ambulatory Visit: Admitting: Cardiology

## 2024-09-04 ENCOUNTER — Other Ambulatory Visit (HOSPITAL_COMMUNITY): Payer: Self-pay

## 2024-09-23 ENCOUNTER — Encounter: Payer: Self-pay | Admitting: Cardiology

## 2024-09-23 ENCOUNTER — Ambulatory Visit: Attending: Cardiology | Admitting: Cardiology

## 2024-09-23 ENCOUNTER — Other Ambulatory Visit (HOSPITAL_COMMUNITY): Payer: Self-pay

## 2024-09-23 VITALS — BP 156/78 | HR 114 | Resp 16 | Ht 63.0 in | Wt 274.0 lb

## 2024-09-23 DIAGNOSIS — I5042 Chronic combined systolic (congestive) and diastolic (congestive) heart failure: Secondary | ICD-10-CM

## 2024-09-23 DIAGNOSIS — I517 Cardiomegaly: Secondary | ICD-10-CM | POA: Diagnosis not present

## 2024-09-23 DIAGNOSIS — I10 Essential (primary) hypertension: Secondary | ICD-10-CM

## 2024-09-23 DIAGNOSIS — E66813 Obesity, class 3: Secondary | ICD-10-CM

## 2024-09-23 DIAGNOSIS — Z87891 Personal history of nicotine dependence: Secondary | ICD-10-CM

## 2024-09-23 DIAGNOSIS — E782 Mixed hyperlipidemia: Secondary | ICD-10-CM | POA: Diagnosis not present

## 2024-09-23 DIAGNOSIS — Z6841 Body Mass Index (BMI) 40.0 and over, adult: Secondary | ICD-10-CM

## 2024-09-23 MED ORDER — DAPAGLIFLOZIN PROPANEDIOL 10 MG PO TABS
10.0000 mg | ORAL_TABLET | Freq: Every day | ORAL | 3 refills | Status: AC
  Filled 2024-09-23 – 2024-10-05 (×2): qty 30, 30d supply, fill #0
  Filled 2024-12-25: qty 30, 30d supply, fill #1

## 2024-09-23 MED ORDER — HYDRALAZINE HCL 50 MG PO TABS
50.0000 mg | ORAL_TABLET | Freq: Three times a day (TID) | ORAL | 3 refills | Status: AC
  Filled 2024-09-23 – 2024-10-05 (×2): qty 90, 30d supply, fill #0

## 2024-09-23 NOTE — Patient Instructions (Addendum)
 Medication Instructions:  Increase Hydralazine  50 mg take one tablet three times daily Decrease Isosorbide  Dinitrate 10 mg three times daily  *If you need a refill on your cardiac medications before your next appointment, please call your pharmacy*  Lab Work: NONE ordered at this time of appointment   Testing/Procedures: NONE ordered at this time of appointment   Follow-Up: At [facility], you and your health needs are our priority.  As part of our continuing mission to provide you with exceptional heart care, our providers are all part of one team.  This team includes your primary Cardiologist (physician) and Advanced Practice Providers or APPs (Physician Assistants and Nurse Practitioners) who all work together to provide you with the care you need, when you need it.  Your next appointment:   6 month ov Dr. Michele 1st available [facility] Dr. Raford 1st available Dr. Neil    We recommend signing up for the patient portal called MyChart.  Sign up information is provided on this After Visit Summary.  MyChart is used to connect with patients for Virtual Visits (Telemedicine).  Patients are able to view lab/test results, encounter notes, upcoming appointments, etc.  Non-urgent messages can be sent to your provider as well.   To learn more about what you can do with MyChart, go to [email or web address].

## 2024-09-23 NOTE — Progress Notes (Signed)
 Cardiology Office Note:  .   ID:  Robin May September 16, 1986, MRN [id number] PCP:  Lafe Domino, DO  Former Cardiology Providers: Dr. Ladona [facility] Providers Cardiologist:  Madonna Large, DO , [facility] (established care 12/09/2020) Electrophysiologist:  None  Click to update primary MD,subspecialty MD or APP then REFRESH:1}    Chief Complaint  Patient presents with   Chronic heart failure with preserved ejection fraction   Follow-up    History of Present Illness: .   Robin May is a 38 y.o. African-American female whose past medical history and cardiovascular risk factors includes: chronic HFpEF, sleep apnea, hypertension, obesity due to excess calories (Body mass index is 48.54 kg/m.), former smoker.   Patient may follow-up with the practice given her history of heart failure.  Patient is accompanied by her mother at today's office visit.  In 2013 patient was diagnosed with HFpEF with LVEF of 45-50% and grade 3 diastolic dysfunction.  With uptitration of medical therapy her echo parameters have improved.  Given the degree of left ventricular hypertrophy patient was recommended to undergo cardiac MRI for further risk stratification.  Since last office visit patient did undergo cardiac MRI which confirms a degree of left ventricular hypertrophy and meets the criteria for HCM; however, when looking at the African-American population subset she does not meet HCM criteria based on LV thickness.  Her findings are likely consistent with hypertensive heart disease.  In addition, she has a LGE burden of approximately 13% and therefore cardiac monitor was performed to evaluate for arrhythmia.  Cardiac monitor notes an average heart rate of 78 bpm and no significant ectopy or arrhythmia noted during the monitoring period.  The cardiac MRI also was concern for possible cardiac sarcoidosis and patient did undergo cardiac PET FDG study which was low probability for cardiac  sarcoidosis at this time.  Clinically she denies anginal chest pain or heart failure symptoms. Office and home blood pressures are not well-controlled on current medical therapy.  Average SBP ranges between 140-150 mmHg. Patient dated she gets headaches and thinks is secondary to hydralazine  and isosorbide  dinitrate and would like to come off of the medication Has started using her CPAP device at least 4 hours a night. Still has not establish care with [facility] and wellness center. No near-syncope or syncopal events.  Review of Systems: .   Review of Systems  Cardiovascular:  Negative for chest pain, claudication, dyspnea on exertion, irregular heartbeat, leg swelling, near-syncope, orthopnea, palpitations, paroxysmal nocturnal dyspnea and syncope.  Respiratory:  Negative for shortness of breath.   Hematologic/Lymphatic: Negative for bleeding problem.  Musculoskeletal:  Negative for muscle cramps and myalgias.  Neurological:  Negative for dizziness and light-headedness.    Studies Reviewed:   EKG: EKG Interpretation Date/Time:  Wednesday September 23 2024 08:08:26 EDT Ventricular Rate:  85 Text Interpretation: Normal sinus rhythm Right superior axis deviation Incomplete right bundle branch block Consider Anterior infarct , age undetermined When compared with ECG of 29-Oct-2023 08:47, PR interval has decreased T wave inversion no longer evident in Lateral leads Confirmed by Large Madonna [id number]) on 09/23/2024 8:14:50 AM  Echocardiogram: 08/20/2012: LVEF 45-50%, diffuse hypokinesis, grade 3 diastolic dysfunction mild AR, mildly dilated left atrium, RVSP 31 mmHg.   03/14/2023: Study Quality: Technically difficult study. Normal LV systolic function with visual EF 50-55%. Left ventricle cavity is normal in size. Severe concentric hypertrophy of the left ventricle. Normal global wall motion. Indeterminate diastolic filling pattern, elevated LAP. Calculated EF 52%.  Trace aortic  regurgitation. Trace tricuspid valve regurgitation and no evidence of pulmonary hypertension. Compared to 12/20/2000 Grade II DD is now indeterminate, mild AR is now trace, otherwise no significant change.    Stress Testing: Lexiscan Sestamibi stress test 12/14/2020: Low risk study.   cMRI 06/03/2024 1. Severe concentric LV hypertrophy measuring 18mm in basal septum (16mm in posterior wall). While this meets criteria for hypertrophic cardiomyopathy (>43mm), would note that the [place] guidelines use a cutoff of 20mm for HCM in black patients with hypertension (especially with CKD). Considering this, and given hypertrophy is symmetric, suspect more likely hypertensive heart disease than HCM. However, there is also significant patchy LGE, primarily at RV insertion sites and lateral wall, with LGE accounting for 13% of total myocardial mass. While can see fibrosis due to hypertensive heart disease, the extent of LGE is concerning for HCM. In addition, while diagnosis is likely hypertensive heart disease vs HCM, would also consider sarcoid on the differential and recommend FDG-PET for further evaluation. Given the extent of LGE, would also recommend cardiac monitor to evaluate for arrhythmias.  2. Normal LV size with mild systolic dysfunction (EF 49%)  3. Normal RV size and systolic function (EF 58%)   Cardiac monitor (Zio Patch): June 10, 2024 -June 24, 2024 Dominant rhythm sinus.  Heart rate 58-164 bpm. Avg HR 78 bpm. No atrial fibrillation detected during the monitoring period. No high grade AV block, pauses (3 seconds or longer). Total supraventricular ectopic burden <1%. Total ventricular ectopic burden <1%. Patient triggered events: 2. Underlying rhythm sinus without ectopy.   Cardiac sarcoidosis evaluation: 07/16/2024  FDG uptake findings are not consistent with active myocardial inflammation/sarcoidosis.  FDG uptake was not observed. LV perfusion is normal.  Left ventricular  function is abnormal. EF: 35%.  Coronary calcium  was absent on the attenuation correction CT images. Radiology over read 1. No acute findings. 2. 2 mm right mid lung nodule. No follow-up needed if patient is low-risk. Non-contrast chest CT can be considered in 12 months if patient is high-risk. This recommendation follows the consensus statement: Guidelines for Management of Incidental Pulmonary Nodules Detected on CT Images: From the Fleischner Society 2017; Radiology 2017; 284:228-243.   RADIOLOGY: NA  Risk Assessment/Calculations:   NA   Labs:       Latest Ref Rng & Units 05/07/2023    2:14 PM 09/22/2014   11:30 AM 08/19/2012    5:19 PM  CBC  WBC 3.4 - 10.8 x10E3/uL 6.1  6.0  8.7   Hemoglobin 11.1 - 15.9 g/dL 85.3  84.9  85.8   Hematocrit 34.0 - 46.6 % 45.4  43.7  41.2   Platelets 150 - 450 x10E3/uL 349  353  335        Latest Ref Rng & Units 05/14/2023   12:27 PM 05/07/2023    2:14 PM 02/05/2022    8:14 AM  BMP  Glucose 70 - 99 mg/dL 95  81  898   BUN 6 - 20 mg/dL 23  23  14    Creatinine 0.57 - 1.00 mg/dL 8.28  8.21  8.62   BUN/Creat Ratio 9 - 23 13  13  10    Sodium 134 - 144 mmol/L 135  139  140   Potassium 3.5 - 5.2 mmol/L 4.3  4.5  4.5   Chloride 96 - 106 mmol/L 103  102  102   CO2 20 - 29 mmol/L 21  22  23    Calcium  8.7 - 10.2 mg/dL 9.4  9.6  9.7       Latest Ref Rng & Units 05/14/2023   12:27 PM 05/07/2023    2:14 PM 02/05/2022    8:14 AM  CMP  Glucose 70 - 99 mg/dL 95  81  898   BUN 6 - 20 mg/dL 23  23  14    Creatinine 0.57 - 1.00 mg/dL 8.28  8.21  8.62   Sodium 134 - 144 mmol/L 135  139  140   Potassium 3.5 - 5.2 mmol/L 4.3  4.5  4.5   Chloride 96 - 106 mmol/L 103  102  102   CO2 20 - 29 mmol/L 21  22  23    Calcium  8.7 - 10.2 mg/dL 9.4  9.6  9.7     Lab Results  Component Value Date   CHOL 145 05/07/2023   HDL 36 (L) 05/07/2023   LDLCALC 86 05/07/2023   LDLDIRECT 90 01/22/2022   TRIG 129 05/07/2023   CHOLHDL 4.0 05/07/2023   No results for  input(s): LIPOA in the last 8760 hours. No components found for: NTPROBNP No results for input(s): PROBNP in the last 8760 hours.  No results for input(s): TSH in the last 8760 hours.  External Labs: Collected: 10/07/2023 provided by creatinine and kidney. Patient follows with Dr. Jayson Player. BUN 23, creatinine 2.08. eGFR 31. BUN to creatinine ratio 11. Sodium 138, potassium 3.6, chloride 104, bicarb 24. AST and ALT within normal limits Hemoglobin 13.5 g/dL  Physical Exam:    Today's Vitals   09/23/24 0805  BP: (!) 156/78  Pulse: (!) 114  Resp: 16  SpO2: 98%  Weight: 274 lb (124.3 kg)  Height: 5' 3 (1.6 m)   Body mass index is 48.54 kg/m. Wt Readings from Last 3 Encounters:  09/23/24 274 lb (124.3 kg)  02/11/24 264 lb (119.7 kg)  10/29/23 265 lb (120.2 kg)    Physical Exam  Constitutional: No distress.  Age appropriate, hemodynamically stable.   Neck: No JVD present.  Cardiovascular: Normal rate, regular rhythm, S1 normal, S2 normal, intact distal pulses and normal pulses. Exam reveals no gallop, no S3 and no S4.  No murmur heard. Pulmonary/Chest: Effort normal and breath sounds normal. No stridor. She has no wheezes. She has no rales.  Abdominal: Soft. Bowel sounds are normal. She exhibits no distension. There is no abdominal tenderness.  Musculoskeletal:        General: No edema.     Cervical back: Neck supple.  Neurological: She is alert and oriented to person, place, and time. She has intact cranial nerves (2-12).  Skin: Skin is warm and moist.     Impression & Recommendation(s):  Impression:   ICD-10-CM   1. Chronic combined systolic and diastolic heart failure (HCC)  P49.57 EKG 12-Lead    2. LVH (left ventricular hypertrophy)  I51.7 AMBULATORY REFERRAL TO HYPERTROPHIC CARDIOMYOPATHY CLINIC    3. Essential hypertension  I10 AMB REFERRAL TO ADVANCED HTN CLINIC    4. Mixed hyperlipidemia  E78.2     5. Former smoker  Z87.891     6. Class 3  severe obesity due to excess calories with serious comorbidity and body mass index (BMI) of 45.0 to 49.9 in adult  E66.813    Z68.42        Recommendation(s):  Chronic heart failure with preserved ejection fraction (HFpEF) (HCC) Stage B, NYHA Class II  Diagnosed in 2013.  Echo August 2013: LVEF 45-50%, diffuse hypokinesis, grade 3 diastolic dysfunction Echo March 2024: LVEF 50-55% indeterminate  diastolic filling pattern Cardiac MRI in June 2025: LVEF 49% Continue carvedilol  25 mg 1.5 tablets twice daily. Continue chlorthalidone  25 mg p.o. daily. Continue Entresto  97/103 mg p.o. twice daily. Continue spironolactone  50 mg p.o. daily Refill Farxiga  10 mg p.o. daily. Increase hydralazine  50 mg p.o. daily to 50 mg p.o. 3 times daily Decrease isosorbide  dinitrate 20 mg p.o. 3 times daily to 10 mg p.o. 3 times daily Patient states that she is having headaches which could be secondary to Imdur or uncontrolled hypertension. Reemphasize importance of low-salt diet and improving her modifiable cardiovascular risk factors. Currently using her CPAP 4 hours per night, recommended using it throughout the night if able to tolerate. Patient states that she has no plans to become pregnant at this time.  If and when she starts considering family-planning patient is advised to call us  to change medications as some of the medications pose risk for fetal development.  Patient and mother verbalized understanding  Left ventricular hypertrophy Underwent cardiac MRI which reconfirms severe LVH-18 mm at the basal septum and 60 mm of the posterior wall.  This meets criteria for hypertrophic cardiomyopathy in general; however, when evaluating the African-American subsets she does not meet HCM criteria. It is felt that her LVH is likely secondary to hypertensive heart disease. However, she has an LGE burden of 13%. Follow-up cardiac monitor illustrated no sustained arrhythmias. Recommend that she follows up with HCM  clinic for further evaluation for HCM and consider risks and benefits of genetic testing given the degree of LVH and LGE burden.  Essential hypertension Office and home BP are not well controlled.  Medication changes as discussed above.   Will refer her to advanced hypertension clinic for evaluation of secondary hypertension. Follows with [facility] as well.    Mixed hyperlipidemia Currently on atorvastatin  40 mg p.o. nightly..   She denies myalgia or other side effects. Most recent lipids dated May 2024, independently reviewed via [facility] database-LDL 86 mg/dL  Class 3 severe obesity due to excess calories with serious comorbidity and body mass index (BMI) of 45.0 to 49.9 in adult [facility]) Body mass index is 48.54 kg/m. Patient would benefit from weight loss clinic consultation/evaluation I reviewed with her importance of diet, regular physical activity/exercise, weight loss.   Patient is educated on the importance of increasing physical activity gradually as tolerated with a goal of moderate intensity exercise for 30 minutes a day 5 days a week.  Orders Placed:  Orders Placed This Encounter  Procedures   AMB REFERRAL TO ADVANCED HTN CLINIC    Referral Priority:   Routine    Referral Type:   Consultation    Referral Reason:   Specialty Services Required    Referred to Provider:   Raford Riggs, MD    Number of Visits Requested:   1   AMBULATORY REFERRAL TO HYPERTROPHIC CARDIOMYOPATHY CLINIC    Referral Priority:   Routine    Referral Type:   Consultation    Referral Reason:   Specialty Services Required    Referred to Provider:   Santo Stanly LABOR, MD    Number of Visits Requested:   1   EKG 12-Lead    Final Medication List:    Meds ordered this encounter  Medications   dapagliflozin  propanediol (FARXIGA ) 10 MG TABS tablet    Sig: Take 1 tablet (10 mg total) by mouth daily.    Dispense:  30 tablet    Refill:  3   hydrALAZINE  (APRESOLINE ) 50 MG  tablet     Sig: Take 1 tablet (50 mg total) by mouth 3 (three) times daily.    Dispense:  90 tablet    Refill:  3    Medications Discontinued During This Encounter  Medication Reason   hydrALAZINE  (APRESOLINE ) 50 MG tablet Reorder   dapagliflozin  propanediol (FARXIGA ) 10 MG TABS tablet Reorder      Current Outpatient Medications:    atorvastatin  (LIPITOR) 40 MG tablet, Take 1 tablet (40 mg total) by mouth daily., Disp: 90 tablet, Rfl: 3   carvedilol  (COREG ) 25 MG tablet, Take 1 and 1/2 tablets (37.5 mg) by mouth 2 times daily with a meal., Disp: 90 tablet, Rfl: 3   chlorthalidone  (HYGROTON ) 25 MG tablet, Take 1 tablet (25 mg total) by mouth daily., Disp: 90 tablet, Rfl: 3   fluticasone  (FLONASE ) 50 MCG/ACT nasal spray, Place 2 sprays into both nostrils daily., Disp: 16 g, Rfl: 6   isosorbide  dinitrate (ISORDIL ) 20 MG tablet, Take 1 tablet by mouth 3 times daily. ---PATIENT NEEDS APPOINTMENT WITH CARDIOLOGIST FOR FUTURE REFILLS (Patient taking differently: Take 10 mg by mouth 3 (three) times daily.), Disp: 90 tablet, Rfl: 0   montelukast  (SINGULAIR ) 10 MG tablet, Take 1 tablet (10 mg total) by mouth at bedtime., Disp: 90 tablet, Rfl: 3   sacubitril -valsartan  (ENTRESTO ) 97-103 MG, Take 1 tablet by mouth 2 times daily.--PATIENT NEEDS APPOINTMENT WITH CARDIOLOGIST FOR FUTURE REFILLS, Disp: 180 tablet, Rfl: 0   sodium chloride  (OCEAN) 0.65 % SOLN nasal spray, Place 1 spray into both nostrils as needed for congestion., Disp: 44 mL, Rfl: 0   spironolactone  (ALDACTONE ) 50 MG tablet, Take 1 tablet (50 mg total) by mouth daily., Disp: 90 tablet, Rfl: 3   dapagliflozin  propanediol (FARXIGA ) 10 MG TABS tablet, Take 1 tablet (10 mg total) by mouth daily., Disp: 30 tablet, Rfl: 3   hydrALAZINE  (APRESOLINE ) 50 MG tablet, Take 1 tablet (50 mg total) by mouth 3 (three) times daily., Disp: 90 tablet, Rfl: 3  Consent:   N/A  Disposition:   6 months, HFpEF Patient may be asked to follow-up sooner based on the  results of the above-mentioned testing.  Her questions and concerns were addressed to her satisfaction. She voices understanding of the recommendations provided during this encounter.    Signed, Madonna Michele HAS, [facility]  A Division of [facility] [address], [place], [place] [address]   09/23/2024 11:46 AM

## 2024-09-24 ENCOUNTER — Ambulatory Visit: Attending: Internal Medicine | Admitting: Internal Medicine

## 2024-09-24 VITALS — BP 150/84 | HR 88 | Ht 63.0 in | Wt 276.0 lb

## 2024-09-24 DIAGNOSIS — I5042 Chronic combined systolic (congestive) and diastolic (congestive) heart failure: Secondary | ICD-10-CM | POA: Diagnosis not present

## 2024-09-24 DIAGNOSIS — I517 Cardiomegaly: Secondary | ICD-10-CM

## 2024-09-24 NOTE — Patient Instructions (Signed)
 Medication Instructions:  Your physician recommends that you continue on your current medications as directed. Please refer to the Current Medication list given to you today.  *If you need a refill on your cardiac medications before your next appointment, please call your pharmacy*  Lab Work: NONE  If you have labs (blood work) drawn today and your tests are completely normal, you will receive your results only by: MyChart Message (if you have MyChart) OR A paper copy in the mail If you have any lab test that is abnormal or we need to change your treatment, we will call you to review the results.  Testing/Procedures: Your physician has requested that you complete Genetic Testing.  Your physician has recommended that you have a cardiopulmonary stress test (CPX). CPX testing is a non-invasive measurement of heart and lung function. It replaces a traditional treadmill stress test. This type of test provides a tremendous amount of information that relates not only to your present condition but also for future outcomes. This test combines measurements of you ventilation, respiratory gas exchange in the lungs, electrocardiogram (EKG), blood pressure and physical response before, during, and following an exercise protocol.   Follow-Up:Based on results  At [facility], you and your health needs are our priority.  As part of our continuing mission to provide you with exceptional heart care, our providers are all part of one team.  This team includes your primary Cardiologist (physician) and Advanced Practice Providers or APPs (Physician Assistants and Nurse Practitioners) who all work together to provide you with the care you need, when you need it.   Provider:   Stanly Santo COME

## 2024-09-24 NOTE — Progress Notes (Signed)
 Cardiology Office Note:  .    Date:  09/24/2024  ID:  May Robin Furnace, DOB [birth year], MRN [id number] PCP: Lafe Domino, DO  [facility] Providers Cardiologist:  Madonna Large, DO     CC: Evaluation of HCM Consulted for the evaluation of hypertrophy substrate at the behest of Dr. Large   History of Present Illness: .    Robin May is a 38 y.o. female who presents for evaluation of potential hypertrophic cardiomyopathy. She was referred by Dr. Large for evaluation of potential hypertrophic cardiomyopathy.  She has a history of an enlarged heart, first discovered in her twenties after experiencing chest pain at work. She was previously evaluated by Dr. Ladona, who identified the enlarged heart. Currently, she has no chest pain, breathing issues, palpitations, or syncope. Recent heart monitor and inflammatory imaging tests were normal.  Her hypertension was first identified at age 84 when she sought birth control. She notes variable blood pressure readings, with some high and others low, and differences between her arms. She is on medication for hypertension, which she took just before the appointment, and her blood pressure was slightly elevated today. She reports that her kidney function tests are not entirely normal.  Family history is significant for hypertension affecting her mother, grandmother, and father. Her grandfather died while on dialysis for kidney problems. There is no known family history of heart problems similar to hers, and no sudden or unexplained deaths in the family. She is the first in her family to be diagnosed with an enlarged heart.  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 She is originally from [place], [place] . She reports no rashes, heat intolerance, or other systemic symptoms.  Discussed the use of AI scribe software for clinical note transcription with the patient, who gave verbal consent  to proceed.   Relevant histories: .  Social  - no SCD, No HCM in family, Long standing hypertension, Pre-K Teacher, sister with no hypertrophy; No children ROS: As per HPI.   Studies Reviewed: .     Cardiac Studies & Procedures   ______________________________________________________________________________________________   STRESS TESTS  PCV MYOCARDIAL PERFUSION WO LEXISCAN 12/14/2020  Narrative Lexiscan Sestamibi stress test 12/14/2020: Stress EKG is non-diagnostic, as this is pharmacological stress test using Lexiscan. Patient was scheduled for Exercise nuclear stress test;  however, she did not reach 85% APMHR so transitioned to Lexiscan. No apparent territory of reversible ischemia, with soft and breast tissue attenuation artifact (BMI 50.2). Left ventricular size is dilated (EDV 137 cc). Left ventricular wall thickness is preserved without regional motion abnormalities. LVEF 58%. No prior study for comparison. Low risk study.   ECHOCARDIOGRAM  PCV ECHOCARDIOGRAM COMPLETE 03/14/2023  Narrative Echocardiogram 03/14/2023: Study Quality: Technically difficult study. Normal LV systolic function with visual EF 50-55%. Left ventricle cavity is normal in size. Severe concentric hypertrophy of the left ventricle. Normal global wall motion. Indeterminate diastolic filling pattern, elevated LAP. Calculated EF 52%. Trace aortic regurgitation. Trace tricuspid valve regurgitation and no evidence of pulmonary hypertension. Compared to 12/20/2000 Grade II DD is now indeterminate, mild AR is now trace, otherwise no significant change.    MONITORS  LONG TERM MONITOR (3-14 DAYS) 07/01/2024  Narrative Cardiac monitor (Zio Patch): June 10, 2024 -June 24, 2024 Dominant rhythm sinus. Heart rate 58-164 bpm.  Avg HR 78 bpm. No atrial fibrillation detected during the monitoring period. No high grade AV block, pauses (3 seconds or  longer). Total supraventricular ectopic burden   <1%. Total ventricular ectopic burden  <1%. Patient triggered events: 2.  Underlying rhythm sinus without ectopy.     CARDIAC MRI  MR CARDIAC MORPHOLOGY W WO CONTRAST 06/01/2024  Narrative CLINICAL DATA:  73F with chronic diastolic heart failure, OSA, HTN, CKD  EXAM: CARDIAC MRI  TECHNIQUE: The patient was scanned on a 1.5 Tesla Siemens magnet. A dedicated cardiac coil was used. Functional imaging was done using Fiesta sequences. 2,3, and 4 chamber views were done to assess for RWMA's. Modified Simpson's rule using a short axis stack was used to calculate an ejection fraction on a dedicated work Research officer, trade [employer]. The patient received 10 cc of Gadavist . After 10 minutes inversion recovery sequences were used to assess for infiltration and scar tissue. Phase contrast velocity mapping was performed above the aortic and pulmonic valves  CONTRAST:  10 cc  of Gadavist   FINDINGS: Left ventricle:  -Normal size  -Mild systolic dysfunction  -Severe hypertrophy measuring 18mm in basal septum (16mm in posterior wall)  -Elevated ECV (34%)  -Normal T2 values  -Patchy midwall LGE, primarily at RV insertion sites and lateral wall. LGE accounts for 13% of total myocardial mass  LV EF:  49% (Normal 52-79%)  Absolute volumes:  LV EDV: (Normal 78-167 mL)  LV ESV: 78mL (Normal 21-64 mL)  LV SV: 76mL (Normal 52-114 mL)  CO: 5.9L/min (Normal 2.7-6.3 L/min)  Indexed volumes:  LV EDV: 44mL/sq-m (Normal 50-96 mL/sq-m)  LV ESV: 77mL/sq-m (Normal 10-40 mL/sq-m)  LV SV: 63mL/sq-m (Normal 33-64 mL/sq-m)  CI: 2.6L/min/sq-m (Normal 1.9-3.9 L/min/sq-m)  Right ventricle: Normal size and systolic function  RV EF: 59% (Normal 52-80%)  Absolute volumes:  RV EDV: (Normal 79-175 mL)  RV ESV: 55mL (Normal 13-75 mL)  RV SV: 77mL (Normal 56-110 mL)  CO: 6.0L/min (Normal 2.7-6 L/min)  Indexed volumes:  RV EDV: 79mL/sq-m (Normal 51-97 mL/sq-m)  RV ESV:  70mL/sq-m (Normal 9-42 mL/sq-m)  RV SV: 52mL/sq-m (Normal 35-61 mL/sq-m)  CI: 2.6L/min/sq-m (Normal 1.8-3.8 L/min/sq-m)  Left atrium: Normal size  Right atrium: Normal size  Mitral valve: Mild regurgitation  Aortic valve: Mild regurgitation  Tricuspid valve: Mild regurgitation  Pulmonic valve: Trivial regurgitation  Aorta: Normal proximal ascending aorta  Pericardium: Normal  IMPRESSION: 1. Severe concentric LV hypertrophy measuring 18mm in basal septum (16mm in posterior wall). While this meets criteria for hypertrophic cardiomyopathy (>49mm), would note that the European guidelines use a cutoff of 20mm for HCM in black patients with hypertension (especially with CKD). Considering this, and given hypertrophy is symmetric, suspect more likely hypertensive heart disease than HCM. However, there is also significant patchy LGE, primarily at RV insertion sites and lateral wall, with LGE accounting for 13% of total myocardial mass. While can see fibrosis due to hypertensive heart disease, the extent of LGE is concerning for HCM. In addition, while diagnosis is likely hypertensive heart disease vs HCM, would also consider sarcoid on the differential and recommend FDG-PET for further evaluation. Given the extent of LGE, would also recommend cardiac monitor to evaluate for arrhythmias.  2.  Normal LV size with mild systolic dysfunction (EF 49%)  3.  Normal RV size and systolic function (EF 58%)   Electronically Signed By: Lonni Nanas M.D. On: 06/03/2024 11:54   ______________________________________________________________________________________________      Physical Exam:    VS:  BP (!) 150/84   Pulse 88   Ht 5' 3 (1.6 m)   Wt 276 lb (125.2 kg)  SpO2 97%   BMI 48.89 kg/m    Wt Readings from Last 3 Encounters:  09/24/24 276 lb (125.2 kg)  09/23/24 274 lb (124.3 kg)  02/11/24 264 lb (119.7 kg)    Gen: no distress   Neck: No JVD Cardiac: No Rubs  or Gallops, no Murmur, RRR +2 radial pulses Respiratory: Clear to auscultation bilaterally, normal effort, normal  respiratory rate GI: Soft, nontender, non-distended  MS: No edema;  moves all extremities Integument: Skin feels well Neuro:  At time of evaluation, alert and oriented to person/place/time/situation  Psych: Normal affect, patient feels  RRR  Repeat BP 130/80   ASSESSMENT AND PLAN: .    Hypertensive heart disease with left ventricular hypertrophy, biatrial enlargement, and mildly reduced ejection fraction Hypertensive heart disease characterized by left ventricular hypertrophy, biatrial enlargement, and mildly reduced ejection fraction, likely due to longstanding hypertension, supported by family history. MRI showed concentric left ventricular hypertrophy and scar tissue, attributed to chronic hypertension. Differential includes hypertrophic cardiomyopathy, but inflammatory imaging negative for sarcoidosis. Goal is aggressive blood pressure control to improve heart function and prevent deterioration. - Initiate aggressive, goal-directed medical therapy for blood pressure control. - Order cardiopulmonary exercise test to assess functional capacity and guide treatment. - Monitor heart function and adjust treatment as necessary.  GENETIC TESTING COUNSELING EVALUATION:  I met with the patient today to discuss cardiac genetic testing. We reviewed the following information:  INDICATION FOR TESTING: * Suspected/confirmed diagnosis of HCM of Cardiomyopathy  TESTING OPTION(S) DISCUSSED: * Helix  cardiac gene panel (HCM and HF)  BENEFITS OF GENETIC TESTING DISCUSSED:  * May confirm or refine clinical diagnosis * May guide treatment decisions and medical management * May provide prognostic information * May identify at-risk family members who could benefit from screening * May avoid unnecessary testing or interventions for family members who test negative * May provide information  for family planning  LIMITATIONS AND RISKS DISCUSSED:  * Testing may not identify a genetic cause, even if one exists * Testing may identify a variant of uncertain significance (VUS) and if negative it does not completely rule out HCM * Results may change over time as more information becomes available * Potential for unexpected or secondary findings * Possible psychological impact of positive or negative results * Possible discrimination concerns  FINANCIAL CONSIDERATIONS DISCUSSED:  * Insurance coverage and potential out-of-pocket costs (suspect with Helix < $100) * Prior authorization requirements Information systems manager)  LIFE INSURANCE/DISABILITY INSURANCE IMPLICATIONS:  * GINA protection applies to health insurance and employment but not life, disability, or long-term care insurance * Recommendation to consider securing insurance prior to testing if concerned * Documentation considerations for insurance applications  FAMILY IMPLICATIONS: * Cascade testing approach for relatives if positive result * Communication strategies with family members * Resources for family discussions  PATIENT UNDERSTANDING AND DECISION:  * Patient demonstrated understanding of the above information * Patient had opportunity to ask questions which were addressed * Patient decision: proceed with testing  FOLLOW-UP PLAN:  * Results disclosure plan:In person for positive, virtual for negative  Stanly Leavens, MD [facility] Cardiologist [facility]  [address] [place], [address] [place], [place] [address] [phone]  11:27 AM  Time Spent Directly with Patient:   I have spent a total of 52 minutes with the patient reviewing notes, imaging, EKGs, labs, and examining the patient as well as establishing an assessment and plan that was discussed personally with the patient. Discussed disease state education, using shared  decision making tools and cardiac modeling , and genetic testing evaluation.  Reviewed care and plan in collaboration with Dr. DIA; testing has been sent. CMR reviewed with patient

## 2024-09-28 NOTE — Addendum Note (Signed)
 Addended by: DELPHINE BRUNO HERO on: 09/28/2024 12:21 PM   Modules accepted: Orders

## 2024-10-05 ENCOUNTER — Other Ambulatory Visit (HOSPITAL_COMMUNITY): Payer: Self-pay

## 2024-10-09 ENCOUNTER — Other Ambulatory Visit: Payer: Self-pay | Admitting: *Deleted

## 2024-10-09 ENCOUNTER — Other Ambulatory Visit (HOSPITAL_COMMUNITY): Payer: Self-pay

## 2024-10-14 ENCOUNTER — Encounter (HOSPITAL_BASED_OUTPATIENT_CLINIC_OR_DEPARTMENT_OTHER): Payer: Self-pay

## 2024-10-14 LAB — HELIX COMPREHENSIVE CARDIOMYOPATHY PANEL: Genetic Analysis Overall Interpretation: UNDETERMINED

## 2024-10-18 ENCOUNTER — Ambulatory Visit: Payer: Self-pay | Admitting: Internal Medicine

## 2024-11-16 NOTE — Progress Notes (Unsigned)
 SABRA

## 2024-11-17 ENCOUNTER — Encounter: Payer: Self-pay | Admitting: Adult Health

## 2024-11-17 ENCOUNTER — Telehealth (INDEPENDENT_AMBULATORY_CARE_PROVIDER_SITE_OTHER): Admitting: Adult Health

## 2024-11-17 DIAGNOSIS — G4733 Obstructive sleep apnea (adult) (pediatric): Secondary | ICD-10-CM

## 2024-11-17 NOTE — Progress Notes (Signed)
 Community message has been sent to [facility]  for pressure and supplies on 11/17/24. DD

## 2024-12-25 ENCOUNTER — Other Ambulatory Visit: Payer: Self-pay

## 2024-12-25 ENCOUNTER — Other Ambulatory Visit (HOSPITAL_COMMUNITY): Payer: Self-pay

## 2024-12-30 ENCOUNTER — Encounter: Payer: Self-pay | Admitting: Emergency Medicine

## 2024-12-30 ENCOUNTER — Ambulatory Visit: Admission: EM | Admit: 2024-12-30 | Discharge: 2024-12-30 | Disposition: A | Discharge status: Home / Self Care

## 2024-12-30 ENCOUNTER — Other Ambulatory Visit (HOSPITAL_COMMUNITY): Payer: Self-pay

## 2024-12-30 DIAGNOSIS — J069 Acute upper respiratory infection, unspecified: Secondary | ICD-10-CM

## 2024-12-30 MED ORDER — BENZONATATE 100 MG PO CAPS
100.0000 mg | ORAL_CAPSULE | Freq: Three times a day (TID) | ORAL | 0 refills | Status: AC
  Filled 2024-12-30: qty 30, 10d supply, fill #0

## 2024-12-30 NOTE — Discharge Instructions (Signed)

## 2024-12-30 NOTE — ED Provider Notes (Signed)
 " [facility]    CSN: [id number] Arrival date & time: 12/30/24  9175      History   Chief Complaint Chief Complaint  Patient presents with   Nasal Congestion   Facial Pain   Cough   Chills    HPI Robin May is a 38 y.o. female.   Pt presents today due to cough productive of clear sputum and purulent nasal drainage for the past 5 days. Pt states that she has been in contact with children with RSV. Pt states that she has been using Vitamin C, Robitussin-DM., flonase , and sinus rinse without relief. Pt had one emesis episode and is eating and drinking without issue now.   The history is provided by the patient.  Cough   Past Medical History:  Diagnosis Date   Allergy    Anginal pain 08/18/2012   mild   CHF (congestive heart failure) (HCC)    Heart failure (HCC) 2016   Hyperlipidemia    Hypertension    Shortness of breath 08/19/2012   w/exertion and w/lying down    Patient Active Problem List   Diagnosis Date Noted   CKD (chronic kidney disease) stage 3, GFR 30-59 ml/min (HCC) 08/18/2020   Mixed hyperlipidemia 08/18/2020   Chronic diastolic congestive heart failure (HCC) 07/13/2020   Essential hypertension 07/13/2020   Perennial allergic rhinitis 09/22/2014   Hypertensive CHF (congestive heart failure) (HCC) 08/19/2012   Morbid obesity (HCC) 08/19/2012    Past Surgical History:  Procedure Laterality Date   NO PAST SURGERIES      OB History     Gravida  0   Para  0   Term  0   Preterm  0   AB  0   Living  0      SAB  0   IAB  0   Ectopic  0   Multiple  0   Live Births  0            Home Medications    Prior to Admission medications  Medication Sig Start Date End Date Taking? Authorizing Provider  atorvastatin  (LIPITOR) 40 MG tablet Take 1 tablet (40 mg total) by mouth daily. 11/01/23 12/30/24 Yes Dameron, Marisa, DO  benzonatate  (TESSALON ) 100 MG capsule Take 1 capsule (100 mg total) by mouth every 8 (eight)  hours. 12/30/24  Yes Andra Krabbe C, PA-C  carvedilol  (COREG ) 25 MG tablet Take 1 and 1/2 tablets (37.5 mg) by mouth 2 times daily with a meal. 11/01/23  Yes Dameron, Barabara, DO  chlorthalidone  (HYGROTON ) 25 MG tablet Take 1 tablet (25 mg total) by mouth daily. 01/10/24  Yes   dapagliflozin  propanediol (FARXIGA ) 10 MG TABS tablet Take 1 tablet (10 mg total) by mouth daily. 09/23/24  Yes Tolia, Sunit, DO  fluticasone  (FLONASE ) 50 MCG/ACT nasal spray Place 2 sprays into both nostrils daily. 08/25/24  Yes Lafe Domino, DO  hydrALAZINE  (APRESOLINE ) 50 MG tablet Take 1 tablet (50 mg total) by mouth 3 (three) times daily. 09/23/24 12/30/24 Yes Tolia, Sunit, DO  isosorbide  dinitrate (ISORDIL ) 20 MG tablet Take 1 tablet by mouth 3 times daily. ---PATIENT NEEDS APPOINTMENT WITH CARDIOLOGIST FOR FUTURE REFILLS Patient taking differently: Take 10 mg by mouth 3 (three) times daily. 07/28/24  Yes Ladona Heinz, MD  montelukast  (SINGULAIR ) 10 MG tablet Take 1 tablet (10 mg total) by mouth at bedtime. 08/10/24  Yes Lafe Domino, DO  sacubitril -valsartan  (ENTRESTO ) 97-103 MG Take 1 tablet by mouth 2 times daily.--PATIENT NEEDS  APPOINTMENT WITH CARDIOLOGIST FOR FUTURE REFILLS 07/28/24  Yes Tolia, Sunit, DO  sodium chloride  (OCEAN) 0.65 % SOLN nasal spray Place 1 spray into both nostrils as needed for congestion. 05/26/20  Yes Darr, Jacob, PA-C  spironolactone  (ALDACTONE ) 50 MG tablet Take 1 tablet (50 mg total) by mouth daily. 01/10/24  Yes     Family History Family History  Problem Relation Age of Onset   Hypertension Mother    Thyroid disease Mother    Diabetes Father    Heart failure Sister    Asthma Sister    Asthma Brother    Hypertension Maternal Grandmother    Hypertension Maternal Grandfather    Heart disease Maternal Grandfather    Kidney failure Maternal Grandfather    Hypertension Paternal Grandmother    Hypertension Paternal Grandfather     Social History Social History[1]   Allergies    Patient has no known allergies.   Review of Systems Review of Systems  Respiratory:  Positive for cough.      Physical Exam Triage Vital Signs ED Triage Vitals  Encounter Vitals Group     BP 12/30/24 0845 (!) 176/99     Girls Systolic BP Percentile --      Girls Diastolic BP Percentile --      Boys Systolic BP Percentile --      Boys Diastolic BP Percentile --      Pulse Rate 12/30/24 0845 78     Resp 12/30/24 0845 16     Temp 12/30/24 0845 98.3 F (36.8 C)     Temp Source 12/30/24 0845 Oral     SpO2 12/30/24 0845 95 %     Weight --      Height --      Head Circumference --      Peak Flow --      Pain Score 12/30/24 0843 6     Pain Loc --      Pain Education --      Exclude from Growth Chart --    No data found.  Updated Vital Signs BP (!) 176/99 (BP Location: Left Arm)   Pulse 78   Temp 98.3 F (36.8 C) (Oral)   Resp 16   LMP 12/21/2024 (Approximate)   SpO2 95%   Visual Acuity Right Eye Distance:   Left Eye Distance:   Bilateral Distance:    Right Eye Near:   Left Eye Near:    Bilateral Near:     Physical Exam Vitals and nursing note reviewed.  Constitutional:      General: She is not in acute distress.    Appearance: Normal appearance. She is not ill-appearing, toxic-appearing or diaphoretic.  HENT:     Nose: Congestion (moderately enlarged turbinates) present. No rhinorrhea.     Mouth/Throat:     Mouth: Mucous membranes are moist.     Pharynx: Oropharynx is clear. No oropharyngeal exudate or posterior oropharyngeal erythema.  Eyes:     General: No scleral icterus. Cardiovascular:     Rate and Rhythm: Normal rate and regular rhythm.     Heart sounds: Normal heart sounds.  Pulmonary:     Effort: Pulmonary effort is normal. No respiratory distress.     Breath sounds: Normal breath sounds. No wheezing or rhonchi.  Skin:    General: Skin is warm.  Neurological:     Mental Status: She is alert and oriented to person, place, and time.   Psychiatric:        Mood and  Affect: Mood normal.        Behavior: Behavior normal.      UC Treatments / Results  Labs (all labs ordered are listed, but only abnormal results are displayed) Labs Reviewed - No data to display  EKG   Radiology No results found.  Procedures Procedures (including critical care time)  Medications Ordered in UC Medications - No data to display  Initial Impression / Assessment and Plan / UC Course  I have reviewed the triage vital signs and the nursing notes.  Pertinent labs & imaging results that were available during my care of the patient were reviewed by me and considered in my medical decision making (see chart for details).      Final Clinical Impressions(s) / UC Diagnoses   Final diagnoses:  Viral URI     Discharge Instructions      You been diagnosed with a viral illness today. -Viruses have to run their course and medicines that are prescribed are meant to help with symptoms. - With viruses usually feel poorly from 3 to 7 days with cough being the last symptoms to resolve.  -Cough can linger from days to weeks.  Antibiotics are not effective for viruses. -If your cough lasts more than 2 weeks and you are coughing so hard that you are vomiting or feel like you could pass out we need to follow-up with PCP for further testing and evaluation. -Rest, increase water intake, may use pseudoephedrine for nasal congestion, Delsym (dextromethorphan) or honey as needed for cough, and ibuprofen and/or Tylenol  as directed on packaging for pain and fever. -If you have hypertension you should take Coricidin or other OTC meds approved for people with high blood pressure. -You may use a spoonful of honey every 4-6 hours as needed for throat pain and cough. -Warm tea with honey and lemon are helpful for soothe throat as well.  Chloraseptic and Cepacol make a throat lozenge with numbing medication, can be purchased over-the-counter. -May also use  Flonase  or sinus rinse for sinus pressure or nasal congestion.  Be sure to use distilled bottled water for sinus rinses. -May use coolmist humidifier to open up nasal passages -May elevate head to assist with postnasal drainage. -If you feel poorly (fever, fatigue, shortness of breath, nausea, etc.) for more than 10 days to be sure to follow-up with PCP or in clinic for further evaluation and additional treatments. If you experience chest pain with shortness of breath or pulse oxygen less than 95% you should report to the ER.     ED Prescriptions     Medication Sig Dispense Auth. Provider   benzonatate  (TESSALON ) 100 MG capsule Take 1 capsule (100 mg total) by mouth every 8 (eight) hours. 30 capsule Andra Corean BROCKS, PA-C      PDMP not reviewed this encounter.    [1]  Social History Tobacco Use   Smoking status: Former    Types: Cigars    Quit date: 12/31/2005    Years since quitting: 19.0   Smokeless tobacco: Never   Tobacco comments:    08/19/2012 smoked black and milds  Vaping Use   Vaping status: Never Used  Substance Use Topics   Alcohol use: Never   Drug use: No     Andra Corean BROCKS, PA-C 12/30/24 [id number]  "

## 2024-12-30 NOTE — ED Triage Notes (Signed)
 Pt reports chest congestion, nasal congestion, sinus pain, productive cough, and chills x5 days. Unsure of fevers. Notes a single emesis episode on Monday, no since able to keep food down. Robitussin-dm, vitamin c taken with no relief. 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
